# Patient Record
Sex: Female | Born: 1995 | Race: Black or African American | Hispanic: No | Marital: Single | Smoking: Never smoker
Health system: Southern US, Community
[De-identification: ages and names within clinical notes are randomized; demographics above are authoritative.]

## PROBLEM LIST (undated history)

## (undated) DIAGNOSIS — I456 Pre-excitation syndrome: Secondary | ICD-10-CM

---
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
## 2015-12-08 ENCOUNTER — Encounter (HOSPITAL_COMMUNITY): Payer: Self-pay | Admitting: Emergency Medicine

## 2015-12-08 ENCOUNTER — Emergency Department (HOSPITAL_COMMUNITY): Payer: No Typology Code available for payment source

## 2015-12-08 ENCOUNTER — Emergency Department (HOSPITAL_COMMUNITY)
Admission: EM | Admit: 2015-12-08 | Discharge: 2015-12-08 | Disposition: A | Discharge status: Home / Self Care | Payer: No Typology Code available for payment source | Attending: Emergency Medicine | Admitting: Emergency Medicine

## 2015-12-08 DIAGNOSIS — Z791 Long term (current) use of non-steroidal anti-inflammatories (NSAID): Secondary | ICD-10-CM | POA: Diagnosis not present

## 2015-12-08 DIAGNOSIS — Y92410 Unspecified street and highway as the place of occurrence of the external cause: Secondary | ICD-10-CM | POA: Diagnosis not present

## 2015-12-08 DIAGNOSIS — Y939 Activity, unspecified: Secondary | ICD-10-CM | POA: Diagnosis not present

## 2015-12-08 DIAGNOSIS — S199XXA Unspecified injury of neck, initial encounter: Secondary | ICD-10-CM | POA: Diagnosis present

## 2015-12-08 DIAGNOSIS — Z79899 Other long term (current) drug therapy: Secondary | ICD-10-CM | POA: Diagnosis not present

## 2015-12-08 DIAGNOSIS — S134XXA Sprain of ligaments of cervical spine, initial encounter: Secondary | ICD-10-CM | POA: Insufficient documentation

## 2015-12-08 DIAGNOSIS — Y999 Unspecified external cause status: Secondary | ICD-10-CM | POA: Diagnosis not present

## 2015-12-08 DIAGNOSIS — Z79891 Long term (current) use of opiate analgesic: Secondary | ICD-10-CM | POA: Insufficient documentation

## 2015-12-08 DIAGNOSIS — I456 Pre-excitation syndrome: Secondary | ICD-10-CM | POA: Insufficient documentation

## 2015-12-08 DIAGNOSIS — S139XXA Sprain of joints and ligaments of unspecified parts of neck, initial encounter: Secondary | ICD-10-CM

## 2015-12-08 MED ORDER — IBUPROFEN 200 MG PO TABS
600.0000 mg | ORAL_TABLET | Freq: Once | ORAL | Status: AC
  Administered 2015-12-08: 600 mg via ORAL
  Filled 2015-12-08: qty 3

## 2015-12-08 MED ORDER — IBUPROFEN 400 MG PO TABS: 400.0000 mg | ORAL_TABLET | Freq: Four times a day (QID) | ORAL | Status: DC | PRN
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
## 2016-08-23 DIAGNOSIS — Z862 Personal history of diseases of the blood and blood-forming organs and certain disorders involving the immune mechanism: Secondary | ICD-10-CM | POA: Insufficient documentation
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
## 2017-08-20 ENCOUNTER — Other Ambulatory Visit (HOSPITAL_COMMUNITY)
Admission: RE | Admit: 2017-08-20 | Discharge: 2017-08-20 | Disposition: A | Discharge status: Home / Self Care | Payer: Medicaid Other | Source: Ambulatory Visit | Attending: Obstetrics | Admitting: Obstetrics

## 2017-08-20 ENCOUNTER — Ambulatory Visit (INDEPENDENT_AMBULATORY_CARE_PROVIDER_SITE_OTHER): Payer: Medicaid Other | Admitting: Obstetrics

## 2017-08-20 ENCOUNTER — Encounter: Payer: Self-pay | Admitting: Obstetrics

## 2017-08-20 VITALS — BP 106/70 | HR 91 | Temp 99.4°F | Resp 18 | Wt 92.0 lb

## 2017-08-20 DIAGNOSIS — Z3009 Encounter for other general counseling and advice on contraception: Secondary | ICD-10-CM | POA: Diagnosis not present

## 2017-08-20 DIAGNOSIS — Z01419 Encounter for gynecological examination (general) (routine) without abnormal findings: Secondary | ICD-10-CM | POA: Insufficient documentation

## 2017-08-20 DIAGNOSIS — N926 Irregular menstruation, unspecified: Secondary | ICD-10-CM | POA: Insufficient documentation

## 2017-08-20 DIAGNOSIS — N946 Dysmenorrhea, unspecified: Secondary | ICD-10-CM | POA: Diagnosis not present

## 2017-08-20 DIAGNOSIS — Z30011 Encounter for initial prescription of contraceptive pills: Secondary | ICD-10-CM | POA: Diagnosis not present

## 2017-08-20 DIAGNOSIS — D508 Other iron deficiency anemias: Secondary | ICD-10-CM | POA: Diagnosis not present

## 2017-08-20 DIAGNOSIS — I456 Pre-excitation syndrome: Secondary | ICD-10-CM | POA: Insufficient documentation

## 2017-08-20 DIAGNOSIS — Z309 Encounter for contraceptive management, unspecified: Secondary | ICD-10-CM | POA: Diagnosis not present

## 2017-08-20 LAB — POCT URINE PREGNANCY: Preg Test, Ur: NEGATIVE

## 2017-08-20 MED ORDER — PRENATAL PLUS 27-1 MG PO TABS: 1.0000 | ORAL_TABLET | Freq: Every day | ORAL | 11 refills | Status: DC

## 2017-08-20 MED ORDER — IBUPROFEN 800 MG PO TABS: 800.0000 mg | ORAL_TABLET | Freq: Three times a day (TID) | ORAL | 5 refills | Status: DC | PRN

## 2017-08-20 MED ORDER — NORETHIN-ETH ESTRAD-FE BIPHAS 1 MG-10 MCG / 10 MCG PO TABS: 1.0000 | ORAL_TABLET | Freq: Every day | ORAL | 11 refills | Status: DC

## 2017-08-20 MED ORDER — CITRANATAL BLOOM DHA 90-1 & 300 MG PO MISC: 1.0000 | Freq: Every day | ORAL | 11 refills | Status: DC

## 2017-08-20 NOTE — Addendum Note (Signed)
Addended by: Natale MilchSTALLING, Shaindel Sweeten D on: 08/20/2017 03:09 PM   Modules accepted: Orders

## 2017-08-20 NOTE — Progress Notes (Signed)
Subjective:        Elizabeth Stanley is a 22 y.o. female here for a routine exam.  Current complaints: Irregular menstrual cycles..    Personal health questionnaire:  Is patient [name], have a family history of breast and/or ovarian cancer: no Is there a family history of uterine cancer diagnosed at age < 25, gastrointestinal cancer, urinary tract cancer, family member who is a Personnel officer syndrome-associated carrier: no Is the patient overweight and hypertensive, family history of diabetes, personal history of gestational diabetes, preeclampsia or PCOS: no Is patient over 49, have PCOS,  family history of premature CHD under age 76, diabetes, smoke, have hypertension or peripheral artery disease:  no At any time, has a partner hit, kicked or otherwise hurt or frightened you?: no Over the past 2 weeks, have you felt down, depressed or hopeless?: no Over the past 2 weeks, have you felt little interest or pleasure in doing things?:no   Gynecologic History Patient's last menstrual period was 06/17/2017 (approximate). Contraception: none Last Pap: none. Results were: n/a Last mammogram: n/a. Results were: n/a  Obstetric History OB History  No data available    Past Medical History:  Diagnosis Date  . Wolf-Parkinson-White syndrome     Past Surgical History:  Procedure Laterality Date  . ABLATION  2014     Current Outpatient Medications:  .  ibuprofen (ADVIL,MOTRIN) 400 MG tablet, Take 1 tablet (400 mg total) by mouth every 6 (six) hours as needed., Disp: 20 tablet, Rfl: 0 .  ibuprofen (ADVIL,MOTRIN) 800 MG tablet, Take 1 tablet (800 mg total) by mouth every 8 (eight) hours as needed for cramping., Disp: 30 tablet, Rfl: 5 .  Norethindrone-Ethinyl Estradiol-Fe Biphas (LO LOESTRIN FE) 1 MG-10 MCG / 10 MCG tablet, Take 1 tablet by mouth daily. Start taking pills on the first day of period., Disp: 1 Package, Rfl: 11 .  prenatal vitamin w/FE, FA (PRENATAL 1 + 1) 27-1 MG TABS  tablet, Take 1 tablet by mouth daily before breakfast., Disp: 30 each, Rfl: 11 Allergies  Allergen Reactions  . Penicillins Palpitations    Social History   Tobacco Use  . Smoking status: Never Smoker  . Smokeless tobacco: Never Used  Substance Use Topics  . Alcohol use: No    History reviewed. No pertinent family history.    Review of Systems  Constitutional: negative for fatigue and weight loss Respiratory: negative for cough and wheezing Cardiovascular: negative for chest pain, fatigue and palpitations Gastrointestinal: negative for abdominal pain and change in bowel habits Musculoskeletal:negative for myalgias Neurological: negative for gait problems and tremors Behavioral/Psych: negative for abusive relationship, depression Endocrine: negative for temperature intolerance    Genitourinary:negative for abnormal menstrual periods, genital lesions, hot flashes, sexual problems and vaginal discharge Integument/breast: negative for breast lump, breast tenderness, nipple discharge and skin lesion(s)    Objective:       BP 106/70 (BP Location: Right Arm, Patient Position: Sitting, Cuff Size: Small)   Pulse 91   Temp 99.4 F (37.4 C) (Oral)   Resp 18   Wt 92 lb (41.7 kg)   LMP 06/17/2017 (Approximate)   BMI 17.97 kg/m  General:   alert  Skin:   no rash or abnormalities  Lungs:   clear to auscultation bilaterally  Heart:   regular rate and rhythm, S1, S2 normal, no murmur, click, rub or gallop  Breasts:   normal without suspicious masses, skin or nipple changes or axillary nodes  Abdomen:  normal findings: no  organomegaly, soft, non-tender and no hernia  Pelvis:  External genitalia: normal general appearance Urinary system: urethral meatus normal and bladder without fullness, nontender Vaginal: normal without tenderness, induration or masses Cervix: normal appearance Adnexa: normal bimanual exam Uterus: anteverted and non-tender, normal size   Lab Review Urine  pregnancy test Labs reviewed yes Radiologic studies reviewed no  50% of 20 min visit spent on counseling and coordination of care.   Assessment:     1. Encounter for routine gynecological examination with Papanicolaou smear of cervix   2. Dysmenorrhea rX: - ibuprofen (ADVIL,MOTRIN) 800 MG tablet; Take 1 tablet (800 mg total) by mouth every 8 (eight) hours as needed for cramping.  Dispense: 30 tablet; Refill: 5  3. Abnormal menstrual cycle rX: - POCT urine pregnancy  4. Encounter for other general counseling and advice on contraception - WANTS ocp'S  5. Encounter for initial prescription of contraceptive pills rX: - Norethindrone-Ethinyl Estradiol-Fe Biphas (LO LOESTRIN FE) 1 MG-10 MCG / 10 MCG tablet; Take 1 tablet by mouth daily. Start taking pills on the first day of period.  Dispense: 1 Package; Refill: 11  6. Iron deficiency anemia secondary to inadequate dietary iron intake rX: - prenatal vitamin w/FE, FA (PRENATAL 1 + 1) 27-1 MG TABS tablet; Take 1 tablet by mouth daily before breakfast.  Dispense: 30 each; Refill: 11   Plan:    Education reviewed: calcium supplements, depression evaluation, low fat, low cholesterol diet, safe sex/STD prevention, self breast exams and weight bearing exercise. Contraception: OCP (estrogen/progesterone). Follow up in: 1 year.   Meds ordered this encounter  Medications  . Norethindrone-Ethinyl Estradiol-Fe Biphas (LO LOESTRIN FE) 1 MG-10 MCG / 10 MCG tablet    Sig: Take 1 tablet by mouth daily. Start taking pills on the first day of period.    Dispense:  1 Package    Refill:  11    Submit other coverage code 3  BIN:  [id number]  PCN:  [id number]   GRP:  [id number]   :  [id number]:  [id number]  . DISCONTD: Prenat w/o A-FeCbGl-DSS-FA-DHA (CITRANATAL BLOOM DHA) 90-1 & 300 MG MISC    Sig: Take 1 tablet by mouth daily before breakfast.    Dispense:  30 each    Refill:  11  . ibuprofen (ADVIL,MOTRIN) 800 MG tablet    Sig: Take 1 tablet (800 mg total) by mouth  every 8 (eight) hours as needed for cramping.    Dispense:  30 tablet    Refill:  5  . prenatal vitamin w/FE, FA (PRENATAL 1 + 1) 27-1 MG TABS tablet    Sig: Take 1 tablet by mouth daily before breakfast.    Dispense:  30 each    Refill:  11   Orders Placed This Encounter  Procedures  . POCT urine pregnancy    Brock BadHARLES A. HARPER MD

## 2017-08-21 ENCOUNTER — Other Ambulatory Visit: Payer: Self-pay | Admitting: Obstetrics

## 2017-08-21 DIAGNOSIS — B9689 Other specified bacterial agents as the cause of diseases classified elsewhere: Secondary | ICD-10-CM

## 2017-08-21 DIAGNOSIS — E569 Vitamin deficiency, unspecified: Secondary | ICD-10-CM

## 2017-08-21 DIAGNOSIS — N760 Acute vaginitis: Principal | ICD-10-CM

## 2017-08-21 LAB — CERVICOVAGINAL ANCILLARY ONLY
Bacterial vaginitis: POSITIVE — AB
CANDIDA VAGINITIS: NEGATIVE
CHLAMYDIA, DNA PROBE: NEGATIVE
NEISSERIA GONORRHEA: NEGATIVE
Trichomonas: NEGATIVE

## 2017-08-21 MED ORDER — SECNIDAZOLE 2 G PO PACK: 1.0000 | PACK | Freq: Once | ORAL | 2 refills | Status: AC

## 2017-08-21 MED ORDER — VITAFOL ULTRA 29-0.6-0.4-200 MG PO CAPS: 1.0000 | ORAL_CAPSULE | Freq: Every day | ORAL | 11 refills | Status: DC

## 2017-08-22 ENCOUNTER — Other Ambulatory Visit: Payer: Self-pay | Admitting: Obstetrics

## 2017-08-22 DIAGNOSIS — D508 Other iron deficiency anemias: Secondary | ICD-10-CM

## 2017-08-22 DIAGNOSIS — N946 Dysmenorrhea, unspecified: Secondary | ICD-10-CM

## 2017-08-22 MED ORDER — PRENATAL PLUS 27-1 MG PO TABS: 1.0000 | ORAL_TABLET | Freq: Every day | ORAL | 11 refills | Status: DC

## 2017-08-22 MED ORDER — IBUPROFEN 800 MG PO TABS: 800.0000 mg | ORAL_TABLET | Freq: Three times a day (TID) | ORAL | 5 refills | Status: DC | PRN

## 2017-08-23 LAB — CYTOLOGY - PAP
Diagnosis: NEGATIVE
HPV: NOT DETECTED

## 2018-01-29 ENCOUNTER — Other Ambulatory Visit: Payer: Self-pay

## 2018-01-29 ENCOUNTER — Encounter (HOSPITAL_COMMUNITY): Payer: Self-pay | Admitting: *Deleted

## 2018-01-29 ENCOUNTER — Emergency Department (HOSPITAL_COMMUNITY)
Admission: EM | Admit: 2018-01-29 | Discharge: 2018-01-29 | Disposition: A | Discharge status: Home / Self Care | Payer: Medicaid Other | Attending: Emergency Medicine | Admitting: Emergency Medicine

## 2018-01-29 DIAGNOSIS — R21 Rash and other nonspecific skin eruption: Secondary | ICD-10-CM | POA: Insufficient documentation

## 2018-01-29 DIAGNOSIS — R197 Diarrhea, unspecified: Secondary | ICD-10-CM | POA: Insufficient documentation

## 2018-01-29 DIAGNOSIS — Z79899 Other long term (current) drug therapy: Secondary | ICD-10-CM | POA: Insufficient documentation

## 2018-01-29 LAB — PREGNANCY, URINE: Preg Test, Ur: NEGATIVE

## 2018-01-29 MED ORDER — CETIRIZINE HCL 10 MG PO TABS: 10.0000 mg | ORAL_TABLET | Freq: Every day | ORAL | 0 refills | Status: DC

## 2018-01-29 MED ORDER — ACETAMINOPHEN 500 MG PO TABS: 1000.0000 mg | ORAL_TABLET | Freq: Once | ORAL | Status: DC

## 2018-01-29 NOTE — ED Triage Notes (Signed)
The pt is c/o a rash over her body intermittently one week  She is also c/o diarrhea and her grandmother reports that she has had palpitations intermittently  Hx wpw

## 2018-01-29 NOTE — ED Provider Notes (Signed)
[facility] EMERGENCY DEPARTMENT Provider Note   CSN: [id number] Arrival date & time: 01/29/18  1558     History   Chief Complaint Chief Complaint  Patient presents with  . Rash  . Diarrhea    HPI Elizabeth Stanley is a 22 y.o. female.  HPI  Patient is a 22 year old female with a history of Wolff-Parkinson-White syndrome (status post ablation), presenting for facial rash, diarrhea, and transient palpitations.  Patient reports that she developed the rash on her face within the last week.  No recent new soaps, lotions, detergents, medications, make-up.  Patient describes the rash is pruritic.  No erythema.  No intraoral lesions, or difficulty breathing or swallowing.  Patient denies any cough, wheezing, or shortness of breath.  Patient reports that she had one episode of soft stool yesterday, as well as one episode of watery stool earlier this morning.  Patient denies any in the last 12 hours.  Patient denies any abdominal pain, nausea or vomiting.  No fever or chills.  Patient reports that prior to the arrival to the emergency department, she experienced an episode of transient palpitations, which she reports she is experienced multiple times since her ablation for WPW, particularly when she "gets sick".  Patient denies any palpitations on arrival to the emergency department.  No remedies tried for rash or diarrhea.  Past Medical History:  Diagnosis Date  . Wolf-Parkinson-White syndrome     There are no active problems to display for this patient.   Past Surgical History:  Procedure Laterality Date  . ABLATION  2014     OB History   None      Home Medications    Prior to Admission medications   Medication Sig Start Date End Date Taking? Authorizing Provider  cetirizine (ZYRTEC) 10 MG tablet Take 1 tablet (10 mg total) by mouth daily for 14 days. 01/29/18 02/12/18  Aviva Kluver B, PA-C  ibuprofen (ADVIL,MOTRIN) 400 MG tablet Take 1 tablet (400 mg  total) by mouth every 6 (six) hours as needed. 12/08/15   Kirichenko, Tatyana, PA-C  ibuprofen (ADVIL,MOTRIN) 800 MG tablet Take 1 tablet (800 mg total) by mouth every 8 (eight) hours as needed for cramping. 08/22/17   Brock Bad, MD  Norethindrone-Ethinyl Estradiol-Fe Biphas (LO LOESTRIN FE) 1 MG-10 MCG / 10 MCG tablet Take 1 tablet by mouth daily. Start taking pills on the first day of period. 08/20/17   Brock Bad, MD  prenatal vitamin w/FE, FA (PRENATAL 1 + 1) 27-1 MG TABS tablet Take 1 tablet by mouth daily before breakfast. 08/22/17   Brock Bad, MD    Family History No family history on file.  Social History Social History   Tobacco Use  . Smoking status: Never Smoker  . Smokeless tobacco: Never Used  Substance Use Topics  . Alcohol use: No  . Drug use: No     Allergies   Penicillins   Review of Systems Review of Systems  Constitutional: Negative for chills and fever.  HENT: Negative for congestion and rhinorrhea.   Respiratory: Negative for cough and shortness of breath.   Cardiovascular: Positive for palpitations. Negative for chest pain.  Gastrointestinal: Positive for diarrhea. Negative for abdominal pain, nausea and vomiting.  Genitourinary: Negative for dysuria, frequency and urgency.  Musculoskeletal: Negative for myalgias.  Skin: Positive for rash.  Allergic/Immunologic: Negative for immunocompromised state.  Neurological: Negative for light-headedness.     Physical Exam Updated Vital Signs BP 103/74 (BP Location: Right  Arm)   Pulse 81   Temp 98 F (36.7 C) (Oral)   Resp 18   Ht 5\' 1"  (1.549 m)   Wt 54.4 kg (120 lb)   LMP 01/24/2018   SpO2 100%   BMI 22.67 kg/m   Physical Exam  Constitutional: She appears well-developed and well-nourished. No distress.  HENT:  Head: Normocephalic and atraumatic.  Mouth/Throat: Oropharynx is clear and moist.  Patient has multiple punctate, papular lesions scattered over the cheeks and a few on the  forehead.  No erythema.  No drainage or lesions.  No intraoral lesions.  Eyes: Pupils are equal, round, and reactive to light. Conjunctivae and EOM are normal.  Neck: Normal range of motion. Neck supple.  Cardiovascular: Normal rate, regular rhythm, S1 normal and S2 normal.  No murmur heard. Pulmonary/Chest: Effort normal and breath sounds normal. She has no wheezes. She has no rales.  Abdominal: Soft. She exhibits no distension. There is no tenderness. There is no guarding.  Musculoskeletal: Normal range of motion. She exhibits no edema or deformity.  Lymphadenopathy:    She has no cervical adenopathy.  Neurological: She is alert.  Cranial nerves grossly intact. Patient moves extremities symmetrically and with good coordination.  Skin: Skin is warm and dry. No rash noted. No erythema.  Psychiatric: She has a normal mood and affect. Her behavior is normal. Judgment and thought content normal.  Nursing note and vitals reviewed.    ED Treatments / Results  Labs (all labs ordered are listed, but only abnormal results are displayed) Labs Reviewed  PREGNANCY, URINE  POC URINE PREG, ED  POC URINE PREG, ED    EKG EKG Interpretation  Date/Time:  Wednesday January 29 2018 16:25:03 EDT Ventricular Rate:  81 PR Interval:  132 QRS Duration: 74 QT Interval:  358 QTC Calculation: 415 R Axis:   84 Text Interpretation:  Normal sinus rhythm Normal ECG Since last EKG, delta waves are no longer present Confirmed by Shaune Pollack [id number]) on 01/29/2018 7:47:54 PM   Radiology No results found.  Procedures Procedures (including critical care time)  Medications Ordered in ED Medications - No data to display   Initial Impression / Assessment and Plan / ED Course  I have reviewed the triage vital signs and the nursing notes.  Pertinent labs & imaging results that were available during my care of the patient were reviewed by me and considered in my medical decision making (see chart for  details).     Patient nontoxic-appearing, afebrile, and in no acute distress.  Rash is papular nature consistent with contact dermatitis versus folliculitis.  No evidence of cutaneous manifestations of systemic disease.  No evidence of tickborne illness, SJS/TEN.  Discussed with patient obtaining nonabrasive and mild facial cleanser, as well as taking Zyrtec and Benadryl for the pruritus.  Discussed that at this time, if not improving, she may need follow-up with primary care provider and/or dermatology.   Since diarrhea is resolving, feel that is most likely separate entity, and there is no evidence of dehydration today.  No abdominal pain associated with diarrhea.  We will treat supportively.  Patient was in normal sinus rhythm in the emergency department, EKG showed resolving changes that were previously seen with WPW.  I instructed patient to follow-up with primary care provider regarding whether she needs to be referred to adult cardiology, as patient was not sure if she has cardiology follow-up at this time.    Patient instructed return the emergency department for abdominal pain, intractable  nausea or vomiting, or fevers with abdominal pain, nausea vomiting and diarrhea.  Patient is in understanding and agrees with the plan of care.  Final Clinical Impressions(s) / ED Diagnoses   Final diagnoses:  Facial rash  Diarrhea, unspecified type    ED Discharge Orders        Ordered    cetirizine (ZYRTEC) 10 MG tablet  Daily     01/29/18 2008       Jenalyn Girdner B, PA-C 01/29/18 2354    Shaune PollackIsaacs, Cameron, MD 01/30/18 1026

## 2018-01-29 NOTE — Discharge Instructions (Signed)
Please see the information and instructions below regarding your visit.  Your diagnoses today include:  1. Facial rash   2. Diarrhea, unspecified type    Examination is very reassuring today.  Does not appear to be emergent cause of rash.  Most causes of diarrhea are benign, especially if they are self resolving, and not associated with fever, blood in the stool, or nausea or vomiting.  Tests performed today include: See side panel of your discharge paperwork for testing performed today. Vital signs are listed at the bottom of these instructions.   Your EKG is normal, and shows no evidence of the accessory pathway of WPW.  Medications prescribed:    Take any prescribed medications only as prescribed, and any over the counter medications only as directed on the packaging.  Please take Zyrtec, 10 mg daily for the itching.  You may also take Benadryl in between doses, 25 mg as long as you are not driving, drinking alcohol or operating machinery going class.  Please also initiate Cetaphil or generic version of such facial wash.  Home care instructions:  Please follow any educational materials contained in this packet.   Follow-up instructions: Please follow-up with your primary care provider in 1-2 weeks for further evaluation of your symptoms if they are not completely improved.    Return instructions:  Please return to the Emergency Department if you experience worsening symptoms.  Please return to the emergency department if you develop any worsening abdominal pain, nausea or vomiting that prevents you from keeping anything down, or new or worsening symptoms.  Please return if you have any other emergent concerns.  Additional Information:   Your vital signs today were: BP 103/74 (BP Location: Right Arm)    Pulse 81    Temp 98 F (36.7 C) (Oral)    Resp 18    Ht 5\' 1"  (1.549 m)    Wt 54.4 kg (120 lb)    LMP 01/24/2018    SpO2 100%    BMI 22.67 kg/m  If your blood pressure (BP)  was elevated on multiple readings during this visit above 130 for the top number or above 80 for the bottom number, please have this repeated by your primary care provider within one month. --------------  Thank you for allowing us to participate in your care today.

## 2018-01-30 LAB — POC URINE PREG, ED: Preg Test, Ur: NEGATIVE

## 2018-08-31 ENCOUNTER — Other Ambulatory Visit: Payer: Self-pay

## 2018-08-31 ENCOUNTER — Encounter (HOSPITAL_COMMUNITY): Payer: Self-pay | Admitting: Emergency Medicine

## 2018-08-31 ENCOUNTER — Emergency Department (HOSPITAL_COMMUNITY)
Admission: EM | Admit: 2018-08-31 | Discharge: 2018-08-31 | Disposition: A | Discharge status: Home / Self Care | Payer: Medicaid Other | Attending: Emergency Medicine | Admitting: Emergency Medicine

## 2018-08-31 DIAGNOSIS — K047 Periapical abscess without sinus: Secondary | ICD-10-CM

## 2018-08-31 MED ORDER — IBUPROFEN 600 MG PO TABS: 600.0000 mg | ORAL_TABLET | Freq: Four times a day (QID) | ORAL | 0 refills | Status: DC | PRN

## 2018-08-31 MED ORDER — CLINDAMYCIN HCL 150 MG PO CAPS: 150.0000 mg | ORAL_CAPSULE | Freq: Four times a day (QID) | ORAL | 0 refills | Status: AC

## 2018-08-31 NOTE — Discharge Instructions (Signed)
Return if any problems.

## 2018-08-31 NOTE — ED Triage Notes (Signed)
Pt reports pain left/ lower jaw. Pt is concerned that her wisdom teeth are infected. Swelling and tendereness noted below the jaw line.

## 2018-08-31 NOTE — ED Provider Notes (Signed)
[facility]-EMERGENCY DEPT Provider Note   CSN: [id number] Arrival date & time: 08/31/18  1343     History   Chief Complaint Chief Complaint  Patient presents with  . Dental Pain    HPI Elizabeth Stanley is a 23 y.o. female.  The history is provided by the patient. No language interpreter was used.  Dental Pain  Location:  Lower Lower teeth location:  17/LL 3rd molar Quality:  Aching Severity:  Moderate Onset quality:  Gradual Timing:  Constant Progression:  Worsening Chronicity:  New Relieved by:  Nothing Worsened by:  Nothing Ineffective treatments:  None tried Associated symptoms: facial pain   Associated symptoms: no drooling     Past Medical History:  Diagnosis Date  . Wolf-Parkinson-White syndrome     There are no active problems to display for this patient.   Past Surgical History:  Procedure Laterality Date  . ABLATION  2014     OB History   No obstetric history on file.      Home Medications    Prior to Admission medications   Medication Sig Start Date End Date Taking? Authorizing Provider  cetirizine (ZYRTEC) 10 MG tablet Take 1 tablet (10 mg total) by mouth daily for 14 days. 01/29/18 02/12/18  Aviva Kluver B, PA-C  clindamycin (CLEOCIN) 150 MG capsule Take 1 capsule (150 mg total) by mouth 4 (four) times daily for 10 days. 08/31/18 09/10/18  Elson Areas, PA-C  ibuprofen (ADVIL,MOTRIN) 600 MG tablet Take 1 tablet (600 mg total) by mouth every 6 (six) hours as needed. 08/31/18   Elson Areas, PA-C  Norethindrone-Ethinyl Estradiol-Fe Biphas (LO LOESTRIN FE) 1 MG-10 MCG / 10 MCG tablet Take 1 tablet by mouth daily. Start taking pills on the first day of period. 08/20/17   Brock Bad, MD  prenatal vitamin w/FE, FA (PRENATAL 1 + 1) 27-1 MG TABS tablet Take 1 tablet by mouth daily before breakfast. 08/22/17   Brock Bad, MD    Family History History reviewed. No pertinent family history.  Social  History Social History   Tobacco Use  . Smoking status: Never Smoker  . Smokeless tobacco: Never Used  Substance Use Topics  . Alcohol use: No  . Drug use: No     Allergies   Penicillins   Review of Systems Review of Systems  HENT: Positive for dental problem. Negative for drooling.   All other systems reviewed and are negative.    Physical Exam Updated Vital Signs BP 114/79   Pulse 84   Temp 98.6 F (37 C) (Oral)   Resp 18   Ht 5\' 1"  (1.549 m)   Wt 43.5 kg   LMP 08/11/2018 (Exact Date)   SpO2 99%   BMI 18.14 kg/m   Physical Exam Vitals signs reviewed. Exam conducted with a chaperone present.  HENT:     Head: Normocephalic.     Comments: Swelling left lower 3rd molar,     Nose: Nose normal.     Mouth/Throat:     Mouth: Mucous membranes are moist.  Eyes:     Pupils: Pupils are equal, round, and reactive to light.  Neck:     Musculoskeletal: Normal range of motion.  Cardiovascular:     Rate and Rhythm: Normal rate.  Pulmonary:     Effort: Pulmonary effort is normal.  Abdominal:     General: Abdomen is flat.  Skin:    General: Skin is warm.  Neurological:  General: No focal deficit present.     Mental Status: She is alert.  Psychiatric:        Mood and Affect: Mood normal.      ED Treatments / Results  Labs (all labs ordered are listed, but only abnormal results are displayed) Labs Reviewed - No data to display  EKG None  Radiology No results found.  Procedures Procedures (including critical care time)  Medications Ordered in ED Medications - No data to display   Initial Impression / Assessment and Plan / ED Course  I have reviewed the triage vital signs and the nursing notes.  Pertinent labs & imaging results that were available during my care of the patient were reviewed by me and considered in my medical decision making (see chart for details).       Final Clinical Impressions(s) / ED Diagnoses   Final diagnoses:   Dental infection    ED Discharge Orders         Ordered    clindamycin (CLEOCIN) 150 MG capsule  4 times daily     08/31/18 1456    ibuprofen (ADVIL,MOTRIN) 600 MG tablet  Every 6 hours PRN     08/31/18 1456        An After Visit Summary was printed and given to the patient.    Elson Areas, [place] 08/31/18 1843    Sabas Sous, MD 09/03/18 1726

## 2019-11-20 ENCOUNTER — Other Ambulatory Visit: Payer: Self-pay

## 2019-11-20 ENCOUNTER — Encounter (HOSPITAL_COMMUNITY): Payer: Self-pay | Admitting: Emergency Medicine

## 2019-11-20 DIAGNOSIS — Z5321 Procedure and treatment not carried out due to patient leaving prior to being seen by health care provider: Secondary | ICD-10-CM | POA: Insufficient documentation

## 2019-11-20 DIAGNOSIS — J029 Acute pharyngitis, unspecified: Secondary | ICD-10-CM | POA: Insufficient documentation

## 2019-11-20 NOTE — ED Triage Notes (Signed)
Patient states that last night she ate a popsicle and after the first bite, felt like something stuck her on the right side of her throat. Patient denies pain but states that it feels swollen and uncomfortable. Patient speaking in full sentences, able to eat and drink without problems.

## 2019-11-21 ENCOUNTER — Emergency Department (HOSPITAL_COMMUNITY)
Admission: EM | Admit: 2019-11-21 | Discharge: 2019-11-21 | Disposition: A | Discharge status: Home / Self Care | Payer: Medicaid Other | Attending: Emergency Medicine | Admitting: Emergency Medicine

## 2019-11-26 ENCOUNTER — Encounter (HOSPITAL_COMMUNITY): Payer: Self-pay

## 2019-11-26 ENCOUNTER — Other Ambulatory Visit: Payer: Self-pay

## 2019-11-26 ENCOUNTER — Ambulatory Visit (HOSPITAL_COMMUNITY)
Admission: EM | Admit: 2019-11-26 | Discharge: 2019-11-26 | Disposition: A | Discharge status: Home / Self Care | Payer: Self-pay | Attending: Physician Assistant | Admitting: Physician Assistant

## 2019-11-26 DIAGNOSIS — R0982 Postnasal drip: Secondary | ICD-10-CM

## 2019-11-26 DIAGNOSIS — R439 Unspecified disturbances of smell and taste: Secondary | ICD-10-CM

## 2019-11-26 DIAGNOSIS — J019 Acute sinusitis, unspecified: Secondary | ICD-10-CM

## 2019-11-26 MED ORDER — FAMOTIDINE 20 MG PO TABS: 20.0000 mg | ORAL_TABLET | Freq: Two times a day (BID) | ORAL | 0 refills | Status: DC

## 2019-11-26 MED ORDER — DOXYCYCLINE HYCLATE 100 MG PO CAPS: 100.0000 mg | ORAL_CAPSULE | Freq: Two times a day (BID) | ORAL | 0 refills | Status: AC

## 2019-11-26 MED ORDER — SALINE SPRAY 0.65 % NA SOLN: 1.0000 | NASAL | 0 refills | Status: DC | PRN

## 2019-11-26 NOTE — ED Provider Notes (Signed)
[facility]    CSN: [id number] Arrival date & time: 11/26/19  1546      History   Chief Complaint Chief Complaint  Patient presents with  . loss of smell    HPI Elizabeth Stanley is a 24 y.o. female.   Patient reports for evaluation of change in her smell.  She reports this has been ongoing for 3 to 4 weeks.  She reports she had a bad sinus infection in mid April and began to lose her sense of smell towards the end of this.  She reports all her sinus and cold-like symptoms resolved completely however she had continued loss of smell.  She reports over the last 2 weeks she has began to have a foul odor in her nose.  She denies any runny nose, congestion.  She does report occasional drainage in her throat.  Denies sore throat.  Denies facial pain or headache.  Denies fever or chills.  She denies anything being stuck in the nose.  She reports her nose piercings are several years old.  She reports she continues to not feel to smell things as they should smell.  She does believe that the foul odor has improved a little bit but is still lingering.  She reports is worse in the morning.  She believes she has bad breath but no one is told her this.  She denies any heartburn or reflux symptoms.     Past Medical History:  Diagnosis Date  . Wolf-Parkinson-White syndrome     There are no problems to display for this patient.   Past Surgical History:  Procedure Laterality Date  . ABLATION  2014    OB History   No obstetric history on file.      Home Medications    Prior to Admission medications   Medication Sig Start Date End Date Taking? Authorizing Provider  cetirizine (ZYRTEC) 10 MG tablet Take 1 tablet (10 mg total) by mouth daily for 14 days. 01/29/18 02/12/18  Aviva Kluver B, PA-C  doxycycline (VIBRAMYCIN) 100 MG capsule Take 1 capsule (100 mg total) by mouth 2 (two) times daily for 5 days. 11/26/19 12/01/19  Reyna Lorenzi, Veryl Speak, PA-C  famotidine (PEPCID) 20 MG tablet  Take 1 tablet (20 mg total) by mouth 2 (two) times daily. 11/26/19   Oriyah Lamphear, Veryl Speak, PA-C  ibuprofen (ADVIL,MOTRIN) 600 MG tablet Take 1 tablet (600 mg total) by mouth every 6 (six) hours as needed. 08/31/18   Elson Areas, PA-C  Norethindrone-Ethinyl Estradiol-Fe Biphas (LO LOESTRIN FE) 1 MG-10 MCG / 10 MCG tablet Take 1 tablet by mouth daily. Start taking pills on the first day of period. 08/20/17   Brock Bad, MD  prenatal vitamin w/FE, FA (PRENATAL 1 + 1) 27-1 MG TABS tablet Take 1 tablet by mouth daily before breakfast. 08/22/17   Brock Bad, MD  sodium chloride (OCEAN) 0.65 % SOLN nasal spray Place 1 spray into both nostrils as needed for congestion. 11/26/19   Taraji Mungo, Veryl Speak, PA-C    Family History History reviewed. No pertinent family history.  Social History Social History   Tobacco Use  . Smoking status: Never Smoker  . Smokeless tobacco: Never Used  Substance Use Topics  . Alcohol use: No  . Drug use: No     Allergies   Penicillins   Review of Systems Review of Systems   Physical Exam Triage Vital Signs ED Triage Vitals  Enc Vitals Group     BP  11/26/19 1607 113/72     Pulse Rate 11/26/19 1607 68     Resp 11/26/19 1607 18     Temp 11/26/19 1607 98.8 F (37.1 C)     Temp Source 11/26/19 1607 Oral     SpO2 11/26/19 1607 100 %     Weight 11/26/19 1608 89 lb 3.2 oz (40.5 kg)     Height --      Head Circumference --      Peak Flow --      Pain Score --      Pain Loc --      Pain Edu? --      Excl. in GC? --    No data found.  Updated Vital Signs BP 113/72 (BP Location: Right Arm)   Pulse 68   Temp 98.8 F (37.1 C) (Oral)   Resp 18   Wt 89 lb 3.2 oz (40.5 kg)   LMP 11/19/2019   SpO2 100%   BMI 16.85 kg/m   Visual Acuity Right Eye Distance:   Left Eye Distance:   Bilateral Distance:    Right Eye Near:   Left Eye Near:    Bilateral Near:     Physical Exam Vitals and nursing note reviewed.  Constitutional:      General: She is not  in acute distress.    Appearance: She is well-developed. She is not ill-appearing.  HENT:     Head: Normocephalic and atraumatic.     Right Ear: Tympanic membrane normal.     Left Ear: Tympanic membrane normal.     Nose:     Comments: Septal ring in place without sign of infection.  There are 2 stones in the right nostril without sign of infection  Turbinates are swollen bilaterally.  There is some nasal discharge on the right however not significant.  No maxillary, frontal or ethmoid sinus tenderness    Mouth/Throat:     Mouth: Mucous membranes are moist.     Comments: There is mild postnasal drip visible Eyes:     Extraocular Movements: Extraocular movements intact.     Conjunctiva/sclera: Conjunctivae normal.     Pupils: Pupils are equal, round, and reactive to light.  Cardiovascular:     Rate and Rhythm: Normal rate and regular rhythm.     Heart sounds: No murmur.  Pulmonary:     Effort: Pulmonary effort is normal. No respiratory distress.     Breath sounds: Normal breath sounds.  Musculoskeletal:     Cervical back: Neck supple.  Skin:    General: Skin is warm and dry.  Neurological:     General: No focal deficit present.     Mental Status: She is alert and oriented to person, place, and time.      UC Treatments / Results  Labs (all labs ordered are listed, but only abnormal results are displayed) Labs Reviewed - No data to display  EKG   Radiology No results found.  Procedures Procedures (including critical care time)  Medications Ordered in UC Medications - No data to display  Initial Impression / Assessment and Plan / UC Course  I have reviewed the triage vital signs and the nursing notes.  Pertinent labs & imaging results that were available during my care of the patient were reviewed by me and considered in my medical decision making (see chart for details).      #Sinusitis #Postnasal drip #Smell disturbance Patient is 24 year old with postnasal  drip and alteration in smell.  Per chart review patient has had 2 neg Covid test over the last month.  Was some consideration for possible false negative and illness reported in April.  Given preceding upper respiratory symptoms and continued postnasal drip with foul odor there is some suspicion for sinusitis.  We will try a 5-day course of doxycycline and encourage nasal saline use.  Also consideration for reflux given will trial famotidine.  Will have patient follow-up with primary care for reevaluation.  She verbalized understand this plan. Final Clinical Impressions(s) / UC Diagnoses   Final diagnoses:  Post-nasal drip  Smell disturbance  Acute sinusitis, recurrence not specified, unspecified location     Discharge Instructions     It is not completely clear why your symptoms are present  You may have a sinus infection that we can not see I would like to try a short course of antibiotics to see if this helps - doxycycline 2 times a day for 5 days - use nasal saline to aid in any further clearance  I would also like for you to day pepcid to see if this aids in the sense of bad breath and smell  Establish with a primary care for follow up       ED Prescriptions    Medication Sig Dispense Auth. Provider   doxycycline (VIBRAMYCIN) 100 MG capsule Take 1 capsule (100 mg total) by mouth 2 (two) times daily for 5 days. 10 capsule Gavino Fouch, Marguerita Beards, PA-C   sodium chloride (OCEAN) 0.65 % SOLN nasal spray Place 1 spray into both nostrils as needed for congestion. 44 mL Maikayla Beggs, Marguerita Beards, PA-C   famotidine (PEPCID) 20 MG tablet Take 1 tablet (20 mg total) by mouth 2 (two) times daily. 30 tablet Malkia Nippert, Marguerita Beards, PA-C     PDMP not reviewed this encounter.   Purnell Shoemaker, PA-C 11/26/19 2100

## 2019-11-26 NOTE — Discharge Instructions (Addendum)
It is not completely clear why your symptoms are present  You may have a sinus infection that we can not see I would like to try a short course of antibiotics to see if this helps - doxycycline 2 times a day for 5 days - use nasal saline to aid in any further clearance  I would also like for you to day pepcid to see if this aids in the sense of bad breath and smell  Establish with a primary care for follow up

## 2019-11-26 NOTE — ED Triage Notes (Signed)
Pt states she has loss her sense of smell. Pt states she has had a Covid test and it was negative a week ago. Pt states she smell something rotten inside of her nose all of the time now.

## 2020-02-09 ENCOUNTER — Encounter: Payer: Self-pay | Admitting: Obstetrics

## 2020-02-09 ENCOUNTER — Ambulatory Visit (INDEPENDENT_AMBULATORY_CARE_PROVIDER_SITE_OTHER): Payer: Self-pay | Admitting: Obstetrics

## 2020-02-09 ENCOUNTER — Other Ambulatory Visit (HOSPITAL_COMMUNITY)
Admission: RE | Admit: 2020-02-09 | Discharge: 2020-02-09 | Disposition: A | Discharge status: Home / Self Care | Payer: Medicaid Other | Source: Ambulatory Visit | Attending: Obstetrics | Admitting: Obstetrics

## 2020-02-09 VITALS — BP 105/64 | HR 84 | Wt 88.8 lb

## 2020-02-09 DIAGNOSIS — Z3009 Encounter for other general counseling and advice on contraception: Secondary | ICD-10-CM

## 2020-02-09 DIAGNOSIS — N898 Other specified noninflammatory disorders of vagina: Secondary | ICD-10-CM | POA: Diagnosis present

## 2020-02-09 DIAGNOSIS — R636 Underweight: Secondary | ICD-10-CM

## 2020-02-09 DIAGNOSIS — Z01419 Encounter for gynecological examination (general) (routine) without abnormal findings: Secondary | ICD-10-CM | POA: Diagnosis present

## 2020-02-09 DIAGNOSIS — Z681 Body mass index (BMI) 19 or less, adult: Secondary | ICD-10-CM

## 2020-02-09 DIAGNOSIS — Z3202 Encounter for pregnancy test, result negative: Secondary | ICD-10-CM

## 2020-02-09 LAB — POCT URINE PREGNANCY: Preg Test, Ur: NEGATIVE

## 2020-02-09 NOTE — Progress Notes (Signed)
Subjective:        Elizabeth Stanley is a 24 y.o. female here for a routine exam.  Current complaints: none.    Personal health questionnaire:  Is patient Elizabeth Stanley, have a family history of breast and/or ovarian cancer: no Is there a family history of uterine cancer diagnosed at age < 37, gastrointestinal cancer, urinary tract cancer, family member who is a Personnel officer syndrome-associated carrier: no Is the patient overweight and hypertensive, family history of diabetes, personal history of gestational diabetes, preeclampsia or PCOS: no Is patient over 63, have PCOS,  family history of premature CHD under age 49, diabetes, smoke, have hypertension or peripheral artery disease:  no At any time, has a partner hit, kicked or otherwise hurt or frightened you?: no Over the past 2 weeks, have you felt down, depressed or hopeless?: no Over the past 2 weeks, have you felt little interest or pleasure in doing things?:no   Gynecologic History Patient's last menstrual period was 12/30/2019 (exact date). Contraception: none Last Pap: 2019. Results were: normal Last mammogram: n/a. Results were: n/a  Obstetric History OB History  Gravida Para Term Preterm AB Living  0 0 0 0 0 0  SAB TAB Ectopic Multiple Live Births  0 0 0 0 0    Past Medical History:  Diagnosis Date  . Wolf-Parkinson-White syndrome     Past Surgical History:  Procedure Laterality Date  . ABLATION  2014     Current Outpatient Medications:  .  cetirizine (ZYRTEC) 10 MG tablet, Take 1 tablet (10 mg total) by mouth daily for 14 days., Disp: 14 tablet, Rfl: 0 .  famotidine (PEPCID) 20 MG tablet, Take 1 tablet (20 mg total) by mouth 2 (two) times daily. (Patient not taking: Reported on 02/09/2020), Disp: 30 tablet, Rfl: 0 .  ibuprofen (ADVIL,MOTRIN) 600 MG tablet, Take 1 tablet (600 mg total) by mouth every 6 (six) hours as needed. (Patient not taking: Reported on 02/09/2020), Disp: 30 tablet, Rfl: 0 .   Norethindrone-Ethinyl Estradiol-Fe Biphas (LO LOESTRIN FE) 1 MG-10 MCG / 10 MCG tablet, Take 1 tablet by mouth daily. Start taking pills on the first day of period. (Patient not taking: Reported on 02/09/2020), Disp: 1 Package, Rfl: 11 .  prenatal vitamin w/FE, FA (PRENATAL 1 + 1) 27-1 MG TABS tablet, Take 1 tablet by mouth daily before breakfast. (Patient not taking: Reported on 02/09/2020), Disp: 30 each, Rfl: 11 .  sodium chloride (OCEAN) 0.65 % SOLN nasal spray, Place 1 spray into both nostrils as needed for congestion. (Patient not taking: Reported on 02/09/2020), Disp: 44 mL, Rfl: 0 Allergies  Allergen Reactions  . Penicillins Palpitations    Social History   Tobacco Use  . Smoking status: Never Smoker  . Smokeless tobacco: Never Used  Substance Use Topics  . Alcohol use: No    History reviewed. No pertinent family history.    Review of Systems  Constitutional: negative for fatigue and weight loss Respiratory: negative for cough and wheezing Cardiovascular: negative for chest pain, fatigue and palpitations Gastrointestinal: negative for abdominal pain and change in bowel habits Musculoskeletal:negative for myalgias Neurological: negative for gait problems and tremors Behavioral/Psych: negative for abusive relationship, depression Endocrine: negative for temperature intolerance    Genitourinary:negative for abnormal menstrual periods, genital lesions, hot flashes, sexual problems and vaginal discharge Integument/breast: negative for breast lump, breast tenderness, nipple discharge and skin lesion(s)    Objective:       BP (!) 105/64   Pulse 84  Wt (!) 88 lb 12.8 oz (40.3 kg)   LMP 12/30/2019 (Exact Date)   BMI 16.78 kg/m  General:   alert and no distress  Skin:   no rash or abnormalities  Lungs:   clear to auscultation bilaterally  Heart:   regular rate and rhythm, S1, S2 normal, no murmur, click, rub or gallop  Breasts:   normal without suspicious masses, skin or  nipple changes or axillary nodes  Abdomen:  normal findings: no organomegaly, soft, non-tender and no hernia  Pelvis:  External genitalia: normal general appearance Urinary system: urethral meatus normal and bladder without fullness, nontender Vaginal: normal without tenderness, induration or masses Cervix: normal appearance Adnexa: normal bimanual exam Uterus: anteverted and non-tender, normal size   Lab Review Urine pregnancy test Labs reviewed yes Radiologic studies reviewed no  50% of 20 min visit spent on counseling and coordination of care.   Assessment:     1. Encounter for routine gynecological examination with Papanicolaou smear of cervix Rx: - Cytology - PAP( [facility]) - POCT urine pregnancy  2. Vaginal discharge Rx: - Cervicovaginal ancillary only  3. Encounter for other general counseling and advice on contraception - declines contraception  4. Mildly underweight adult.  Probably hereditary. - will follow clinically    Plan:    Education reviewed: calcium supplements, depression evaluation, low fat, low cholesterol diet, safe sex/STD prevention, self breast exams and weight bearing exercise. Contraception: none. Follow up in: 1 year.    Orders Placed This Encounter  Procedures  . POCT urine pregnancy    Brock Bad, MD 02/09/2020 3:26 PM

## 2020-02-09 NOTE — Progress Notes (Signed)
Pt is here for annual gyn exam. Last pap 08/20/2017, normal. Pt is currently SA, she declines any contraception at this time.

## 2020-02-10 LAB — CERVICOVAGINAL ANCILLARY ONLY
Bacterial Vaginitis (gardnerella): NEGATIVE
Candida Glabrata: NEGATIVE
Candida Vaginitis: NEGATIVE
Chlamydia: NEGATIVE
Comment: NEGATIVE
Comment: NEGATIVE
Comment: NEGATIVE
Comment: NEGATIVE
Comment: NEGATIVE
Comment: NORMAL
Neisseria Gonorrhea: NEGATIVE
Trichomonas: NEGATIVE

## 2020-02-11 LAB — CYTOLOGY - PAP: Diagnosis: NEGATIVE

## 2020-03-13 ENCOUNTER — Other Ambulatory Visit: Payer: Self-pay

## 2020-03-13 ENCOUNTER — Emergency Department (HOSPITAL_COMMUNITY)
Admission: EM | Admit: 2020-03-13 | Discharge: 2020-03-13 | Disposition: A | Discharge status: Home / Self Care | Payer: Medicaid Other | Attending: Emergency Medicine | Admitting: Emergency Medicine

## 2020-03-13 ENCOUNTER — Encounter (HOSPITAL_COMMUNITY): Payer: Self-pay

## 2020-03-13 DIAGNOSIS — Z5321 Procedure and treatment not carried out due to patient leaving prior to being seen by health care provider: Secondary | ICD-10-CM | POA: Insufficient documentation

## 2020-03-13 DIAGNOSIS — R110 Nausea: Secondary | ICD-10-CM | POA: Insufficient documentation

## 2020-03-13 DIAGNOSIS — R109 Unspecified abdominal pain: Secondary | ICD-10-CM | POA: Insufficient documentation

## 2020-03-13 LAB — BASIC METABOLIC PANEL
Anion gap: 6 (ref 5–15)
BUN: 21 mg/dL — ABNORMAL HIGH (ref 6–20)
CO2: 22 mmol/L (ref 22–32)
Calcium: 9.1 mg/dL (ref 8.9–10.3)
Chloride: 108 mmol/L (ref 98–111)
Creatinine, Ser: 0.77 mg/dL (ref 0.44–1.00)
GFR calc Af Amer: >60 mL/min (ref >60)
GFR calc non Af Amer: >60 mL/min (ref >60)
Glucose, Bld: 106 mg/dL — ABNORMAL HIGH (ref 70–99)
Potassium: 3.6 mmol/L (ref 3.5–5.1)
Sodium: 136 mmol/L (ref 135–145)

## 2020-03-13 LAB — URINALYSIS, ROUTINE W REFLEX MICROSCOPIC
Bilirubin Urine: NEGATIVE
Glucose, UA: NEGATIVE mg/dL
Hgb urine dipstick: NEGATIVE
Ketones, ur: NEGATIVE mg/dL
Leukocytes,Ua: NEGATIVE
Nitrite: NEGATIVE
Protein, ur: NEGATIVE mg/dL
Specific Gravity, Urine: 1.024 (ref 1.005–1.030)
pH: 6 (ref 5.0–8.0)

## 2020-03-13 LAB — CBC
HCT: 33.4 % — ABNORMAL LOW (ref 36.0–46.0)
Hemoglobin: 11.2 g/dL — ABNORMAL LOW (ref 12.0–15.0)
MCH: 29.7 pg (ref 26.0–34.0)
MCHC: 33.5 g/dL (ref 30.0–36.0)
MCV: 88.6 fL (ref 80.0–100.0)
Platelets: 194 10*3/uL (ref 150–400)
RBC: 3.77 MIL/uL — ABNORMAL LOW (ref 3.87–5.11)
RDW: 13.3 % (ref 11.5–15.5)
WBC: 5.8 10*3/uL (ref 4.0–10.5)
nRBC: 0 % (ref 0.0–0.2)

## 2020-03-13 LAB — PREGNANCY, URINE: Preg Test, Ur: NEGATIVE

## 2020-03-13 NOTE — ED Triage Notes (Signed)
Arrived POV from home. Patient reports left flank pain that woke her from her sleep this morning. Patient also reports nausea without vomiting

## 2020-03-14 LAB — URINE CULTURE: Culture: NO GROWTH

## 2020-05-18 ENCOUNTER — Other Ambulatory Visit: Payer: Self-pay

## 2020-05-18 ENCOUNTER — Encounter: Payer: Self-pay | Admitting: Family Medicine

## 2020-05-18 ENCOUNTER — Ambulatory Visit (INDEPENDENT_AMBULATORY_CARE_PROVIDER_SITE_OTHER): Payer: Self-pay | Admitting: Family Medicine

## 2020-05-18 VITALS — BP 100/63 | HR 85 | Temp 98.3°F | Ht 61.0 in | Wt 91.0 lb

## 2020-05-18 DIAGNOSIS — R7309 Other abnormal glucose: Secondary | ICD-10-CM

## 2020-05-18 DIAGNOSIS — Z13220 Encounter for screening for lipoid disorders: Secondary | ICD-10-CM

## 2020-05-18 DIAGNOSIS — D649 Anemia, unspecified: Secondary | ICD-10-CM

## 2020-05-18 DIAGNOSIS — F411 Generalized anxiety disorder: Secondary | ICD-10-CM

## 2020-05-18 DIAGNOSIS — R109 Unspecified abdominal pain: Secondary | ICD-10-CM

## 2020-05-18 DIAGNOSIS — Z1321 Encounter for screening for nutritional disorder: Secondary | ICD-10-CM

## 2020-05-18 NOTE — Patient Instructions (Signed)
Health Maintenance, Female Adopting a healthy lifestyle and getting preventive care are important in promoting health and wellness. Ask your health care provider about:  The right schedule for you to have regular tests and exams.  Things you can do on your own to prevent diseases and keep yourself healthy. What should I know about diet, weight, and exercise? Eat a healthy diet   Eat a diet that includes plenty of vegetables, fruits, low-fat dairy products, and lean protein.  Do not eat a lot of foods that are high in solid fats, added sugars, or sodium. Maintain a healthy weight Body mass index (BMI) is used to identify weight problems. It estimates body fat based on height and weight. Your health care provider can help determine your BMI and help you achieve or maintain a healthy weight. Get regular exercise Get regular exercise. This is one of the most important things you can do for your health. Most adults should:  Exercise for at least 150 minutes each week. The exercise should increase your heart rate and make you sweat (moderate-intensity exercise).  Do strengthening exercises at least twice a week. This is in addition to the moderate-intensity exercise.  Spend less time sitting. Even light physical activity can be beneficial. Watch cholesterol and blood lipids Have your blood tested for lipids and cholesterol at 24 years of age, then have this test every 5 years. Have your cholesterol levels checked more often if:  Your lipid or cholesterol levels are high.  You are older than 24 years of age.  You are at high risk for heart disease. What should I know about cancer screening? Depending on your health history and family history, you may need to have cancer screening at various ages. This may include screening for:  Breast cancer.  Cervical cancer.  Colorectal cancer.  Skin cancer.  Lung cancer. What should I know about heart disease, diabetes, and high blood  pressure? Blood pressure and heart disease  High blood pressure causes heart disease and increases the risk of stroke. This is more likely to develop in people who have high blood pressure readings, are of African descent, or are overweight.  Have your blood pressure checked: ? Every 3-5 years if you are 18-39 years of age. ? Every year if you are 40 years old or older. Diabetes Have regular diabetes screenings. This checks your fasting blood sugar level. Have the screening done:  Once every three years after age 40 if you are at a normal weight and have a low risk for diabetes.  More often and at a younger age if you are overweight or have a high risk for diabetes. What should I know about preventing infection? Hepatitis B If you have a higher risk for hepatitis B, you should be screened for this virus. Talk with your health care provider to find out if you are at risk for hepatitis B infection. Hepatitis C Testing is recommended for:  Everyone born from 1945 through 1965.  Anyone with known risk factors for hepatitis C. Sexually transmitted infections (STIs)  Get screened for STIs, including gonorrhea and chlamydia, if: ? You are sexually active and are younger than 24 years of age. ? You are older than 24 years of age and your health care provider tells you that you are at risk for this type of infection. ? Your sexual activity has changed since you were last screened, and you are at increased risk for chlamydia or gonorrhea. Ask your health care provider if   you are at risk.  Ask your health care provider about whether you are at high risk for HIV. Your health care provider may recommend a prescription medicine to help prevent HIV infection. If you choose to take medicine to prevent HIV, you should first get tested for HIV. You should then be tested every 3 months for as long as you are taking the medicine. Pregnancy  If you are about to stop having your period (premenopausal) and  you may become pregnant, seek counseling before you get pregnant.  Take 400 to 800 micrograms (mcg) of folic acid every day if you become pregnant.  Ask for birth control (contraception) if you want to prevent pregnancy. Osteoporosis and menopause Osteoporosis is a disease in which the bones lose minerals and strength with aging. This can result in bone fractures. If you are 65 years old or older, or if you are at risk for osteoporosis and fractures, ask your health care provider if you should:  Be screened for bone loss.  Take a calcium or vitamin D supplement to lower your risk of fractures.  Be given hormone replacement therapy (HRT) to treat symptoms of menopause. Follow these instructions at home: Lifestyle  Do not use any products that contain nicotine or tobacco, such as cigarettes, e-cigarettes, and chewing tobacco. If you need help quitting, ask your health care provider.  Do not use street drugs.  Do not share needles.  Ask your health care provider for help if you need support or information about quitting drugs. Alcohol use  Do not drink alcohol if: ? Your health care provider tells you not to drink. ? You are pregnant, may be pregnant, or are planning to become pregnant.  If you drink alcohol: ? Limit how much you use to 0-1 drink a day. ? Limit intake if you are breastfeeding.  Be aware of how much alcohol is in your drink. In the [place] one drink equals one 12 oz bottle of beer (355 mL), one 5 oz glass of wine (148 mL), or one 1 oz glass of hard liquor (44 mL). General instructions  Schedule regular health, dental, and eye exams.  Stay current with your vaccines.  Tell your health care provider if: ? You often feel depressed. ? You have ever been abused or do not feel safe at home. Summary  Adopting a healthy lifestyle and getting preventive care are important in promoting health and wellness.  Follow your health care provider's instructions about healthy  diet, exercising, and getting tested or screened for diseases.  Follow your health care provider's instructions on monitoring your cholesterol and blood pressure. This information is not intended to replace advice given to you by your health care provider. Make sure you discuss any questions you have with your health care provider. Document Revised: 06/25/2018 Document Reviewed: 06/25/2018 Elsevier Patient Education  2020 [employer]vier Inc[employer]  

## 2020-05-18 NOTE — Progress Notes (Signed)
11/3/20212:39 PM  Elizabeth Stanley [birth year], 25 y.o., female [id number]  Chief Complaint  Patient presents with  . New Patient (Initial Visit)  . Gastroesophageal Reflux    x 1 to 2 months while laying down   . lab results from 8/29 ER visit    states did not receive results     HPI:   Patient is a 23 y.o. female with no past medical history who presents today for annual physical   Screening Flu: Declines Covid: Declines Hepatitis C screen: declines HIV screen: declines  Gyn: Dr. 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   Allergies  Allergen Reactions  . Penicillins Palpitations    Prior to Admission medications   Medication Sig Start Date End Date Taking? Authorizing Provider  cetirizine (ZYRTEC) 10 MG tablet Take 1 tablet (10 mg total) by mouth daily for 14 days. 01/29/18 02/12/18  Aviva Kluver B, PA-C  famotidine (PEPCID) 20 MG tablet Take 1 tablet (20 mg total) by mouth 2 (two) times daily. Patient not taking: Reported on 02/09/2020 11/26/19   Darr, Gerilyn Pilgrim, PA-C  ibuprofen (ADVIL,MOTRIN) 600 MG tablet Take 1 tablet (600 mg total) by mouth every 6 (six) hours as needed. Patient not taking: Reported on 02/09/2020 08/31/18   Elson Areas, PA-C  Norethindrone-Ethinyl Estradiol-Fe Biphas (LO LOESTRIN FE) 1 MG-10 MCG / 10 MCG tablet Take 1 tablet by mouth daily. Start taking pills on the first day of period. Patient not taking: Reported on 02/09/2020 08/20/17   Brock Bad, MD  prenatal vitamin w/FE, FA (PRENATAL 1 + 1) 27-1 MG TABS tablet Take 1 tablet by mouth daily before breakfast. Patient not taking:  Reported on 02/09/2020 08/22/17   Brock Bad, MD  sodium chloride (OCEAN) 0.65 % SOLN nasal spray Place 1 spray into both nostrils as needed for congestion. Patient not taking: Reported on 02/09/2020 11/26/19   Darr, Celine Mans    Past Medical History:  Diagnosis Date  . Wolf-Parkinson-White syndrome     Past Surgical History:  Procedure Laterality Date  . ABLATION  2014    Social History   Tobacco Use  . Smoking status: Never Smoker  . Smokeless tobacco: Never Used  Substance Use Topics  . Alcohol use: No    No family history on file.  Review of Systems  Constitutional: Negative for chills, fever and malaise/fatigue.  HENT: Negative for hearing loss and sinus pain.   Eyes: Negative for blurred vision and double vision.  Respiratory: Negative for cough, shortness of breath and wheezing.   Cardiovascular: Negative for chest pain, palpitations and leg swelling.  Gastrointestinal: Negative for abdominal pain, blood in stool, constipation, diarrhea, heartburn, nausea and vomiting.  Genitourinary: Positive for flank pain (Left side 3 weeks ago, none currently). Negative for dysuria, frequency and hematuria.  Musculoskeletal: Negative for back pain and joint pain.  Skin: Negative for rash.  Neurological: Negative for dizziness, weakness and headaches.  Psychiatric/Behavioral: Negative for depression. The patient is nervous/anxious.      OBJECTIVE:  Today's Vitals   05/18/20 1400  BP: 100/63  Pulse: 85  Temp: 98.3 F (36.8 C)  SpO2: 98%  Weight: 91 lb (41.3 kg)  Height: 5\' 1"  (1.549 m)   Body mass index is 17.19 kg/m.   Physical Exam Vitals reviewed.  Constitutional:      Appearance: Normal appearance.  HENT:     Right Ear: Tympanic membrane and ear canal normal.     Left Ear: Tympanic membrane and ear canal normal.     Nose: Nose normal.  Eyes:     Extraocular Movements: Extraocular movements intact.     Pupils: Pupils are equal, round, and reactive to  light.  Cardiovascular:     Rate and Rhythm: Normal rate and regular rhythm.     Pulses: Normal pulses.     Heart sounds: Normal heart sounds. No murmur heard.  No friction rub. No gallop.   Pulmonary:     Effort: Pulmonary effort is normal. No respiratory distress.     Breath sounds: Normal breath sounds. No wheezing, rhonchi or rales.  Abdominal:     General: Abdomen is flat. Bowel sounds are normal. There is no distension.     Palpations: Abdomen is soft.     Tenderness: There is no abdominal tenderness. There is no right CVA tenderness or left CVA tenderness.  Musculoskeletal:        General: Normal range of motion.     Cervical back: Normal range of motion.  Skin:    General: Skin is warm and dry.     Capillary Refill: Capillary refill takes less than 2 seconds.  Neurological:     General: No focal deficit present.     Mental Status: She is alert and oriented to person, place, and time.  Psychiatric:        Mood and Affect: Mood normal.        Behavior: Behavior normal.     No results found for this or any previous visit (from the past 24 hour(s)).  No results found.   ASSESSMENT and PLAN  Problem List Items Addressed This Visit    None    Visit Diagnoses    Anemia, unspecified type    -  Primary   Relevant Orders   CBC   Reticulocytes   Iron, TIBC and Ferritin Panel   Screening for lipid disorders       Relevant Orders   Lipid Panel   Encounter for vitamin deficiency screening       Relevant Orders   Vitamin D, 25-hydroxy   Elevated random blood glucose level       Relevant Orders   Hemoglobin A1c   Abdominal pain, unspecified abdominal location       Relevant Orders   Comprehensive metabolic panel   Generalized anxiety disorder        Will follow up with lab results Discussed health maintenance, educated on vaccines and birth control.   Return in about 1 year (around 05/18/2021).    13/09/2020 Ardella Chhim, FNP-BC Primary Care at [facility] [address] [place], [place] Ph.  [phone] Fax [phone]

## 2020-05-19 ENCOUNTER — Encounter: Payer: Self-pay | Admitting: Family Medicine

## 2020-05-19 LAB — IRON,TIBC AND FERRITIN PANEL
Ferritin: 16 ng/mL (ref 15–150)
Iron Saturation: 14 % — ABNORMAL LOW (ref 15–55)
Iron: 46 ug/dL (ref 27–159)
Total Iron Binding Capacity: 318 ug/dL (ref 250–450)
UIBC: 272 ug/dL (ref 131–425)

## 2020-05-19 LAB — LIPID PANEL
Chol/HDL Ratio: 2.7 ratio (ref 0.0–4.4)
Cholesterol, Total: 162 mg/dL (ref 100–199)
HDL: 59 mg/dL (ref >39)
LDL Chol Calc (NIH): 82 mg/dL (ref 0–99)
Triglycerides: 116 mg/dL (ref 0–149)
VLDL Cholesterol Cal: 21 mg/dL (ref 5–40)

## 2020-05-19 LAB — COMPREHENSIVE METABOLIC PANEL
ALT: 16 IU/L (ref 0–32)
AST: 23 IU/L (ref 0–40)
Albumin/Globulin Ratio: 1.7 (ref 1.2–2.2)
Albumin: 4.4 g/dL (ref 3.9–5.0)
Alkaline Phosphatase: 80 IU/L (ref 44–121)
BUN/Creatinine Ratio: 14 (ref 9–23)
BUN: 11 mg/dL (ref 6–20)
Bilirubin Total: 0.3 mg/dL (ref 0.0–1.2)
CO2: 25 mmol/L (ref 20–29)
Calcium: 9.4 mg/dL (ref 8.7–10.2)
Chloride: 106 mmol/L (ref 96–106)
Creatinine, Ser: 0.78 mg/dL (ref 0.57–1.00)
GFR calc Af Amer: 123 mL/min/{1.73_m2} (ref >59)
GFR calc non Af Amer: 107 mL/min/{1.73_m2} (ref >59)
Globulin, Total: 2.6 g/dL (ref 1.5–4.5)
Glucose: 82 mg/dL (ref 65–99)
Potassium: 3.9 mmol/L (ref 3.5–5.2)
Sodium: 142 mmol/L (ref 134–144)
Total Protein: 7 g/dL (ref 6.0–8.5)

## 2020-05-19 LAB — CBC
Hematocrit: 38.9 % (ref 34.0–46.6)
Hemoglobin: 12.6 g/dL (ref 11.1–15.9)
MCH: 28.9 pg (ref 26.6–33.0)
MCHC: 32.4 g/dL (ref 31.5–35.7)
MCV: 89 fL (ref 79–97)
Platelets: 228 10*3/uL (ref 150–450)
RBC: 4.36 x10E6/uL (ref 3.77–5.28)
RDW: 13.2 % (ref 11.7–15.4)
WBC: 4.1 10*3/uL (ref 3.4–10.8)

## 2020-05-19 LAB — HEMOGLOBIN A1C
Est. average glucose Bld gHb Est-mCnc: 108 mg/dL
Hgb A1c MFr Bld: 5.4 % (ref 4.8–5.6)

## 2020-05-19 LAB — RETICULOCYTES: Retic Ct Pct: 1.2 % (ref 0.6–2.6)

## 2020-05-19 LAB — VITAMIN D 25 HYDROXY (VIT D DEFICIENCY, FRACTURES): Vit D, 25-Hydroxy: 7.5 ng/mL — ABNORMAL LOW (ref 30.0–100.0)

## 2020-07-13 ENCOUNTER — Telehealth: Payer: Self-pay

## 2020-07-13 ENCOUNTER — Encounter: Payer: Self-pay | Admitting: Family Medicine

## 2020-07-13 NOTE — Telephone Encounter (Signed)
Called pt regarding her MyChart message about having a Question about her A1C. Informed the pt her A1C was 5.4 and that she was within normal range. I explained to the pt the prediabetes was 5.6-6.4. pt seemed to under stand this information at the time of the call.

## 2020-07-26 ENCOUNTER — Encounter: Payer: Self-pay | Admitting: Family Medicine

## 2020-07-26 ENCOUNTER — Ambulatory Visit (INDEPENDENT_AMBULATORY_CARE_PROVIDER_SITE_OTHER): Payer: Self-pay | Admitting: Family Medicine

## 2020-07-26 ENCOUNTER — Other Ambulatory Visit: Payer: Self-pay

## 2020-07-26 VITALS — BP 103/57 | HR 100 | Temp 98.2°F | Ht 61.0 in | Wt 87.0 lb

## 2020-07-26 DIAGNOSIS — G43009 Migraine without aura, not intractable, without status migrainosus: Secondary | ICD-10-CM

## 2020-07-26 DIAGNOSIS — E559 Vitamin D deficiency, unspecified: Secondary | ICD-10-CM | POA: Insufficient documentation

## 2020-07-26 MED ORDER — RIZATRIPTAN BENZOATE 5 MG PO TABS: 5.0000 mg | ORAL_TABLET | ORAL | 1 refills | Status: AC | PRN

## 2020-07-26 NOTE — Patient Instructions (Addendum)
Migraine Headache A migraine headache is an intense, throbbing pain on one side or both sides of the head. Migraine headaches may also cause other symptoms, such as nausea, vomiting, and sensitivity to light and noise. A migraine headache can last from 4 hours to 3 days. Talk with your doctor about what things may bring on (trigger) your migraine headaches. What are the causes? The exact cause of this condition is not known. However, a migraine may be caused when nerves in the brain become irritated and release chemicals that cause inflammation of blood vessels. This inflammation causes pain. This condition may be triggered or caused by:  Drinking alcohol.  Smoking.  Taking medicines, such as: ? Medicine used to treat chest pain (nitroglycerin). ? Birth control pills. ? Estrogen. ? Certain blood pressure medicines.  Eating or drinking products that contain nitrates, glutamate, aspartame, or tyramine. Aged cheeses, chocolate, or caffeine may also be triggers.  Doing physical activity. Other things that may trigger a migraine headache include:  Menstruation.  Pregnancy.  Hunger.  Stress.  Lack of sleep or too much sleep.  Weather changes.  Fatigue. What increases the risk? The following factors may make you more likely to experience migraine headaches:  Being a certain age. This condition is more common in people who are 35-1 years old.  Being female.  Having a family history of migraine headaches.  Being Caucasian.  Having a mental health condition, such as depression or anxiety.  Being obese. What are the signs or symptoms? The main symptom of this condition is pulsating or throbbing pain. This pain may:  Happen in any area of the head, such as on one side or both sides.  Interfere with daily activities.  Get worse with physical activity.  Get worse with exposure to bright lights or loud noises. Other symptoms may  include:  Nausea.  Vomiting.  Dizziness.  General sensitivity to bright lights, loud noises, or smells. Before you get a migraine headache, you may get warning signs (an aura). An aura may include:  Seeing flashing lights or having blind spots.  Seeing bright spots, halos, or zigzag lines.  Having tunnel vision or blurred vision.  Having numbness or a tingling feeling.  Having trouble talking.  Having muscle weakness. Some people have symptoms after a migraine headache (postdromal phase), such as:  Feeling tired.  Difficulty concentrating. How is this diagnosed? A migraine headache can be diagnosed based on:  Your symptoms.  A physical exam.  Tests, such as: ? CT scan or an MRI of the head. These imaging tests can help rule out other causes of headaches. ? Taking fluid from the spine (lumbar puncture) and analyzing it (cerebrospinal fluid analysis, or CSF analysis). How is this treated? This condition may be treated with medicines that:  Relieve pain.  Relieve nausea.  Prevent migraine headaches. Treatment for this condition may also include:  Acupuncture.  Lifestyle changes like avoiding foods that trigger migraine headaches.  Biofeedback.  Cognitive behavioral therapy. Follow these instructions at home: Medicines  Take over-the-counter and prescription medicines only as told by your health care provider.  Ask your health care provider if the medicine prescribed to you: ? Requires you to avoid driving or using heavy machinery. ? Can cause constipation. You may need to take these actions to prevent or treat constipation:  Drink enough fluid to keep your urine pale yellow.  Take over-the-counter or prescription medicines.  Eat foods that are high in fiber, such as beans, whole grains,  and fresh fruits and vegetables.  Limit foods that are high in fat and processed sugars, such as fried or sweet foods. Lifestyle  Do not drink alcohol.  Do not  use any products that contain nicotine or tobacco, such as cigarettes, e-cigarettes, and chewing tobacco. If you need help quitting, ask your health care provider.  Get at least 8 hours of sleep every night.  Find ways to manage stress, such as meditation, deep breathing, or yoga. General instructions  Keep a journal to find out what may trigger your migraine headaches. For example, write down: ? What you eat and drink. ? How much sleep you get. ? Any change to your diet or medicines.  If you have a migraine headache: ? Avoid things that make your symptoms worse, such as bright lights. ? It may help to lie down in a dark, quiet room. ? Do not drive or use heavy machinery. ? Ask your health care provider what activities are safe for you while you are experiencing symptoms.  Keep all follow-up visits as told by your health care provider. This is important.      Contact a health care provider if:  You develop symptoms that are different or more severe than your usual migraine headache symptoms.  You have more than 15 headache days in one month. Get help right away if:  Your migraine headache becomes severe.  Your migraine headache lasts longer than 72 hours.  You have a fever.  You have a stiff neck.  You have vision loss.  Your muscles feel weak or like you cannot control them.  You start to lose your balance often.  You have trouble walking.  You faint.  You have a seizure. Summary  A migraine headache is an intense, throbbing pain on one side or both sides of the head. Migraines may also cause other symptoms, such as nausea, vomiting, and sensitivity to light and noise.  This condition may be treated with medicines and lifestyle changes. You may also need to avoid certain things that trigger a migraine headache.  Keep a journal to find out what may trigger your migraine headaches.  Contact your health care provider if you have more than 15 headache days in a  month or you develop symptoms that are different or more severe than your usual migraine headache symptoms. This information is not intended to replace advice given to you by your health care provider. Make sure you discuss any questions you have with your health care provider. Document Revised: 10/24/2018 Document Reviewed: 08/14/2018 Elsevier Patient Education  2021 [employer].   If you have lab work done today you will be contacted with your lab results within the next 2 weeks.  If you have not heard from [place] then please contact us. The fastest way to get your results is to register for My Chart.   IF you received an x-ray today, you will receive an invoice from [facility]. Please contact [facility] at [phone] with questions or concerns regarding your invoice.   IF you received labwork today, you will receive an invoice from [facility]. Please contact [facility] at [phone] with questions or concerns regarding your invoice.   Our billing staff will not be able to assist you with questions regarding bills from these companies.  You will be contacted with the lab results as soon as they are available. The fastest way to get your results is to activate your My Chart account. Instructions are located on the last  page of this paperwork. If you have not heard from [place] regarding the results in 2 weeks, please contact this office.

## 2020-07-26 NOTE — Progress Notes (Signed)
1/11/202210:17 AM  Elizabeth Stanley February 09, 1996, 25 y.o., female [id number]  Chief Complaint  Patient presents with  . Headache    Hit R side head on car door a few months ago and has had sharp pains intermitently since    HPI:   Patient is a 25 y.o. female with past medical history significant for WPW with ablation and anemia who presents today for headaches.  Sharp pain on back of head every now and then Bumped head on car door 2 months ago Notices this pain once or twice a week Lasts off and on the whole day Later turns into a headache Denies neck and shoulder pain Denies nausea and photophobia Denies headaches in the past   Last vitamin D Declined prescription vitamin D weekly, will continue OTC supplement Lab Results  Component Value Date   VD25OH 7.5 (L) 05/18/2020   Lab Results  Component Value Date   HGBA1C 5.4 05/18/2020     Depression screen PHQ 2/9 07/26/2020 08/20/2017  Decreased Interest 0 0  Down, Depressed, Hopeless 0 -  PHQ - 2 Score 0 0    Fall Risk  07/26/2020  Falls in the past year? 0  Number falls in past yr: 0  Injury with Fall? 0  Follow up Falls evaluation completed     Allergies  Allergen Reactions  . Penicillins Palpitations    Prior to Admission medications   Medication Sig Start Date End Date Taking? Authorizing Provider  Ergocalciferol (VITAMIN D2) 50 MCG (2000 UT) TABS Take by mouth.   Yes [provider]    Past Medical History:  Diagnosis Date  . Wolf-Parkinson-White syndrome     Past Surgical History:  Procedure Laterality Date  . ABLATION  2014    Social History   Tobacco Use  . Smoking status: Never Smoker  . Smokeless tobacco: Never Used  Substance Use Topics  . Alcohol use: No    History reviewed. No pertinent family history.  Review of Systems  Constitutional: Negative for chills, fever and malaise/fatigue.  HENT: Negative for hearing loss and tinnitus.   Eyes: Negative for blurred  vision, double vision and photophobia.  Respiratory: Negative for cough, shortness of breath and wheezing.   Cardiovascular: Negative for chest pain and palpitations.  Gastrointestinal: Negative for abdominal pain, blood in stool, constipation, diarrhea, heartburn, nausea and vomiting.  Genitourinary: Negative for dysuria, frequency and hematuria.  Musculoskeletal: Negative for back pain, falls, joint pain, myalgias and neck pain.  Skin: Negative for rash.  Neurological: Positive for headaches. Negative for dizziness, tingling, tremors, sensory change and weakness.     OBJECTIVE:  Today's Vitals   07/26/20 0955  BP: (!) 103/57  Pulse: 100  Temp: 98.2 F (36.8 C)  SpO2: 97%  Weight: 87 lb (39.5 kg)  Height: 5\' 1"  (1.549 m)   Body mass index is 16.44 kg/m.   Physical Exam Constitutional:      General: She is not in acute distress.    Appearance: Normal appearance. She is not ill-appearing.  HENT:     Head: Normocephalic.  Eyes:     Extraocular Movements: Extraocular movements intact.     Pupils: Pupils are equal, round, and reactive to light.  Cardiovascular:     Rate and Rhythm: Normal rate and regular rhythm.     Pulses: Normal pulses.     Heart sounds: Normal heart sounds. No murmur heard. No friction rub. No gallop.   Pulmonary:     Effort: Pulmonary effort  is normal. No respiratory distress.     Breath sounds: Normal breath sounds. No stridor. No wheezing, rhonchi or rales.  Abdominal:     General: Bowel sounds are normal.     Palpations: Abdomen is soft.     Tenderness: There is no abdominal tenderness.  Musculoskeletal:     Cervical back: Full passive range of motion without pain, normal range of motion and neck supple. No rigidity. No muscular tenderness.     Right lower leg: No edema.     Left lower leg: No edema.  Skin:    General: Skin is warm and dry.  Neurological:     General: No focal deficit present.     Mental Status: She is alert and oriented to  person, place, and time. Mental status is at baseline.     Sensory: No sensory deficit.     Motor: No weakness.     Coordination: Coordination normal.  Psychiatric:        Mood and Affect: Mood normal.        Behavior: Behavior normal.     No results found for this or any previous visit (from the past 24 hour(s)).  No results found.   ASSESSMENT and PLAN  Problem List Items Addressed This Visit      Other   Vitamin D deficiency    Other Visit Diagnoses    Migraine without aura and without status migrainosus, not intractable    -  Primary   Relevant Medications   rizatriptan (MAXALT) 5 MG tablet    Plan:  Encouraged to keep a headache diary Will attempt maxalt, discussed r/se/b of treatment Discussed potential scans and follow up plan RTC/ ED precautions provided Discussed conservative and OTC treatments. Continue Vitamin D replacement OTC   Return in about 3 months (around 10/24/2020).    Macario Carls Porfirio Bollier, FNP-BC Primary Care at [facility] [address] [place], [place] [address] Ph.  [phone] Fax [phone]

## 2020-10-24 ENCOUNTER — Ambulatory Visit: Payer: Medicaid Other | Admitting: Family Medicine

## 2020-10-31 ENCOUNTER — Ambulatory Visit: Payer: Self-pay | Admitting: Family Medicine

## 2021-04-12 ENCOUNTER — Encounter (HOSPITAL_COMMUNITY): Payer: Self-pay | Admitting: Emergency Medicine

## 2021-04-12 ENCOUNTER — Other Ambulatory Visit: Payer: Self-pay

## 2021-04-12 ENCOUNTER — Emergency Department (HOSPITAL_COMMUNITY): Payer: Self-pay

## 2021-04-12 ENCOUNTER — Emergency Department (HOSPITAL_COMMUNITY)
Admission: EM | Admit: 2021-04-12 | Discharge: 2021-04-12 | Disposition: A | Discharge status: Home / Self Care | Payer: Self-pay | Attending: Emergency Medicine | Admitting: Emergency Medicine

## 2021-04-12 DIAGNOSIS — R079 Chest pain, unspecified: Secondary | ICD-10-CM | POA: Insufficient documentation

## 2021-04-12 DIAGNOSIS — R002 Palpitations: Secondary | ICD-10-CM | POA: Insufficient documentation

## 2021-04-12 LAB — CBC
HCT: 35.6 % — ABNORMAL LOW (ref 36.0–46.0)
Hemoglobin: 12.1 g/dL (ref 12.0–15.0)
MCH: 29.4 pg (ref 26.0–34.0)
MCHC: 34 g/dL (ref 30.0–36.0)
MCV: 86.6 fL (ref 80.0–100.0)
Platelets: 191 10*3/uL (ref 150–400)
RBC: 4.11 MIL/uL (ref 3.87–5.11)
RDW: 13.5 % (ref 11.5–15.5)
WBC: 6 10*3/uL (ref 4.0–10.5)
nRBC: 0 % (ref 0.0–0.2)

## 2021-04-12 LAB — BASIC METABOLIC PANEL
Anion gap: 5 (ref 5–15)
BUN: 15 mg/dL (ref 6–20)
CO2: 25 mmol/L (ref 22–32)
Calcium: 9.6 mg/dL (ref 8.9–10.3)
Chloride: 106 mmol/L (ref 98–111)
Creatinine, Ser: 0.7 mg/dL (ref 0.44–1.00)
GFR, Estimated: >60 mL/min (ref >60)
Glucose, Bld: 122 mg/dL — ABNORMAL HIGH (ref 70–99)
Potassium: 3.3 mmol/L — ABNORMAL LOW (ref 3.5–5.1)
Sodium: 136 mmol/L (ref 135–145)

## 2021-04-12 LAB — HCG, QUANTITATIVE, PREGNANCY: hCG, Beta Chain, Quant, S: <1 m[IU]/mL (ref <5)

## 2021-04-12 LAB — TROPONIN I (HIGH SENSITIVITY): Troponin I (High Sensitivity): <2 ng/L (ref <18)

## 2021-04-12 MED ORDER — POTASSIUM CHLORIDE CRYS ER 20 MEQ PO TBCR
40.0000 meq | EXTENDED_RELEASE_TABLET | Freq: Once | ORAL | Status: AC
  Administered 2021-04-12: 40 meq via ORAL
  Filled 2021-04-12: qty 2

## 2021-04-12 NOTE — ED Triage Notes (Signed)
Pt reports sudden onset of chest tightness and pain that began yesterday. Also endorses tingling in the left arm and pain in left jaw. Hx of WPW and cardiac ablation procedure. Pt states she has these sx occasionally, but the sx have been occurring more frequently, recently.

## 2021-04-12 NOTE — Discharge Instructions (Addendum)
Follow up with your Cardiologist  Return for new or worsening symptoms

## 2021-04-12 NOTE — ED Notes (Signed)
I-stat beta changed to hCG quantitative. Sent to main lab. [employer]

## 2021-04-12 NOTE — ED Provider Notes (Signed)
[facility]-EMERGENCY DEPT Provider Note   CSN: [id number] Arrival date & time: 04/12/21  0051    History Chief Complaint  Patient presents with   Chest Pain    Elizabeth Stanley is a 25 y.o. female with past medical history significant for WPW, S/p ablation in 2014 presents for chest pain, palpitations.  Intermittent.  Began yesterday.  Nonexertional, nonpleuritic in nature.  States will also intermittently get tingling all over when this occurs.  No back pain.  She has been given these episodes more frequently as of lately.  She is not currently followed by cardiology or EP.  No headache, neck pain, unilateral weakness, facial droop, back pain, abdominal pain, lateral leg swelling, redness or warmth.  No prior history of PE or DVT.  No recent surgery, immobilization or malignancy.  Rates her current pain a 0/10.  Currently asymptomatic.  History obtained from patient, friend in room and past medical records.  No interpreter used.  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  Social History   Tobacco Use   Smoking status: Never   Smokeless tobacco: Never  Vaping Use   Vaping Use: Never used  Substance Use Topics   Alcohol use: No   Drug use: No    Home Medications Prior to Admission medications   Medication Sig Start Date End Date Taking? Authorizing Provider  Ergocalciferol (VITAMIN D2) 50 MCG (2000 UT) TABS Take  by mouth.    [provider]  rizatriptan (MAXALT) 5 MG tablet Take 1 tablet (5 mg total) by mouth as needed for migraine. May repeat in 2 hours if needed 07/26/20   Just, Azalee Course, FNP    Allergies    Penicillins  Review of Systems   Review of Systems  Constitutional: Negative.   HENT: Negative.    Respiratory: Negative.    Cardiovascular:  Positive for chest pain and palpitations. Negative for leg swelling.  Gastrointestinal: Negative.   Genitourinary: Negative.   Musculoskeletal: Negative.   Skin: Negative.   Neurological:  Negative for dizziness, tremors, seizures, syncope, facial asymmetry, speech difficulty, weakness, light-headedness and headaches.       Tingling all over  All other systems reviewed and are negative.  Physical Exam Updated Vital Signs BP 109/61   Pulse 98   Temp 98.2 F (36.8 C) (Oral)   Resp 18   Ht 5\' 1"  (1.549 m)   Wt 40.4 kg   LMP 03/03/2021   SpO2 100%   BMI 16.82 kg/m   Physical Exam Vitals and nursing note reviewed.  Constitutional:      General: She is not in acute distress.  Appearance: She is well-developed. She is not ill-appearing, toxic-appearing or diaphoretic.  HENT:     Head: Normocephalic and atraumatic.  Eyes:     Pupils: Pupils are equal, round, and reactive to light.  Cardiovascular:     Rate and Rhythm: Normal rate.     Pulses:          Radial pulses are 2+ on the right side and 2+ on the left side.       Dorsalis pedis pulses are 2+ on the right side and 2+ on the left side.     Heart sounds: Normal heart sounds.  Pulmonary:     Effort: Pulmonary effort is normal. No respiratory distress.     Breath sounds: Normal breath sounds.     Comments: Clear Bl, speaks in full sentences without difficulty Chest:     Comments: Equal rise and fall to chest wall, nontender Abdominal:     General: Bowel sounds are normal. There is no distension.     Palpations: Abdomen is soft. There is no mass.     Tenderness: There  is no abdominal tenderness. There is no guarding or rebound.     Comments: Soft, nontender  Musculoskeletal:        General: Normal range of motion.     Cervical back: Normal range of motion.     Right lower leg: No tenderness. No edema.     Left lower leg: No tenderness. No edema.     Comments: No bony tenderness.  Moves all 4 extremities without difficulty.  Compartment soft.  Skin:    General: Skin is warm and dry.     Capillary Refill: Capillary refill takes less than 2 seconds.  Neurological:     General: No focal deficit present.     Mental Status: She is alert.     Comments: Mental Status:  Alert, oriented, thought content appropriate. Speech fluent without evidence of aphasia. Able to follow 2 step commands without difficulty.  Cranial Nerves:  II:  Peripheral visual fields grossly normal, pupils equal, round, reactive to light III,IV, VI: ptosis not present, extra-ocular motions intact bilaterally  V,VII: smile symmetric, facial light touch sensation equal VIII: hearing grossly normal bilaterally  IX,X: midline uvula rise  XI: bilateral shoulder shrug equal and strong XII: midline tongue extension  Motor:  5/5 in upper and lower extremities bilaterally including strong and equal grip strength and dorsiflexion/plantar flexion Sensory: Touch normal in all extremities.  Cerebellar: normal finger-to-nose with bilateral upper extremities Gait: normal gait and balance CV: distal pulses palpable throughout    Psychiatric:        Mood and Affect: Mood normal.    ED Results / Procedures / Treatments   Labs (all labs ordered are listed, but only abnormal results are displayed) Labs Reviewed  BASIC METABOLIC PANEL - Abnormal; Notable for the following components:      Result Value   Potassium 3.3 (*)    Glucose, Bld 122 (*)    All other components within normal limits  CBC - Abnormal; Notable for the following components:   HCT 35.6 (*)    All other components within normal  limits  HCG, QUANTITATIVE, PREGNANCY  I-STAT BETA HCG BLOOD, ED ([facility], [facility], [facility] ONLY)  TROPONIN I (HIGH SENSITIVITY)    EKG None  Radiology DG Chest 2 View  Result Date: 04/12/2021 CLINICAL DATA:  Chest pain EXAM: CHEST - 2 VIEW COMPARISON:  03/29/2011 FINDINGS: Lungs are clear.  No pleural effusion or  pneumothorax. The heart is normal in size. Visualized osseous structures are within normal limits. IMPRESSION: Normal chest radiographs. Electronically Signed   By: Charline Bills M.D.   On: 04/12/2021 01:38    Procedures Procedures   Medications Ordered in ED Medications  potassium chloride SA (KLOR-CON) CR tablet 40 mEq (40 mEq Oral Given 04/12/21 0414)   ED Course  I have reviewed the triage vital signs and the nursing notes.  Pertinent labs & imaging results that were available during my care of the patient were reviewed by me and considered in my medical decision making (see chart for details).  15 -year-old here for evaluation of chest pain and palpitations.  Nonexertional nonpleuritic in nature.  She is afebrile, nonseptic, non-ill-appearing. Sx began yesterday. Palpitations chest tightness and al over tingling sensation. She is PERC negative, Wells criteria low risk.  Symptoms not seem consistent with ACS, dissection.  She has no clinical evidence of VTE on exam.  She has a nonfocal neuro exam without deficits.   Work up started from triage which I personally reviewed and interpreted:  Trop <2 Preg neg CBC without leukocytosis BMP mild hypokalemia, given supplementation DG chest without acute abnormality EKG without ischemic changes  Unclear etiology of patient's symptoms.  No arrhythmia here in the emergency department.  Work-up reassuring.  We will have her follow-up with her prior cardiologist.  She will return for new worsening symptoms.  The patient has been appropriately medically screened and/or stabilized in the ED. I have low suspicion for any other emergent  medical condition which would require further screening, evaluation or treatment in the ED or require inpatient management.  Patient is hemodynamically stable and in no acute distress.  Patient able to ambulate in department prior to ED.  Evaluation does not show acute pathology that would require ongoing or additional emergent interventions while in the emergency department or further inpatient treatment.  I have discussed the diagnosis with the patient and answered all questions.  Pain is been managed while in the emergency department and patient has no further complaints prior to discharge.  Patient is comfortable with plan discussed in room and is stable for discharge at this time.  I have discussed strict return precautions for returning to the emergency department.  Patient was encouraged to follow-up with PCP/specialist refer to at discharge.      MDM Rules/Calculators/A&P                            Final Clinical Impression(s) / ED Diagnoses Final diagnoses:  Nonspecific chest pain  Palpitations    Rx / DC Orders ED Discharge Orders     None        Klair Leising A, PA-C 04/12/21 0415    Molpus, Jonny Ruiz, MD 04/12/21 [phone]

## 2021-05-25 ENCOUNTER — Encounter: Payer: Medicaid Other | Admitting: Family Medicine

## 2021-07-20 DIAGNOSIS — I456 Pre-excitation syndrome: Secondary | ICD-10-CM | POA: Diagnosis not present

## 2021-07-20 DIAGNOSIS — K111 Hypertrophy of salivary gland: Secondary | ICD-10-CM | POA: Diagnosis not present

## 2021-07-20 DIAGNOSIS — Z807 Family history of other malignant neoplasms of lymphoid, hematopoietic and related tissues: Secondary | ICD-10-CM | POA: Diagnosis not present

## 2021-07-20 DIAGNOSIS — Z0000 Encounter for general adult medical examination without abnormal findings: Secondary | ICD-10-CM | POA: Diagnosis not present

## 2021-08-08 DIAGNOSIS — N3000 Acute cystitis without hematuria: Secondary | ICD-10-CM | POA: Diagnosis not present

## 2021-09-14 ENCOUNTER — Ambulatory Visit: Payer: Medicaid Other | Admitting: Obstetrics

## 2022-09-18 ENCOUNTER — Encounter: Payer: Self-pay | Admitting: Obstetrics

## 2022-09-18 ENCOUNTER — Ambulatory Visit (INDEPENDENT_AMBULATORY_CARE_PROVIDER_SITE_OTHER): Payer: Medicaid Other | Admitting: Obstetrics

## 2022-09-18 ENCOUNTER — Other Ambulatory Visit (HOSPITAL_COMMUNITY)
Admission: RE | Admit: 2022-09-18 | Discharge: 2022-09-18 | Disposition: A | Discharge status: Home / Self Care | Payer: Medicaid Other | Source: Ambulatory Visit | Attending: Obstetrics | Admitting: Obstetrics

## 2022-09-18 VITALS — BP 117/69 | HR 86 | Ht 61.0 in | Wt 89.0 lb

## 2022-09-18 DIAGNOSIS — N946 Dysmenorrhea, unspecified: Secondary | ICD-10-CM | POA: Diagnosis not present

## 2022-09-18 DIAGNOSIS — Z30016 Encounter for initial prescription of transdermal patch hormonal contraceptive device: Secondary | ICD-10-CM

## 2022-09-18 DIAGNOSIS — Z113 Encounter for screening for infections with a predominantly sexual mode of transmission: Secondary | ICD-10-CM

## 2022-09-18 DIAGNOSIS — Z01419 Encounter for gynecological examination (general) (routine) without abnormal findings: Secondary | ICD-10-CM

## 2022-09-18 DIAGNOSIS — N898 Other specified noninflammatory disorders of vagina: Secondary | ICD-10-CM | POA: Insufficient documentation

## 2022-09-18 DIAGNOSIS — D508 Other iron deficiency anemias: Secondary | ICD-10-CM

## 2022-09-18 DIAGNOSIS — Z3009 Encounter for other general counseling and advice on contraception: Secondary | ICD-10-CM

## 2022-09-18 MED ORDER — IBUPROFEN 800 MG PO TABS: 800.0000 mg | ORAL_TABLET | Freq: Three times a day (TID) | ORAL | 5 refills | Status: AC | PRN

## 2022-09-18 MED ORDER — XULANE 150-35 MCG/24HR TD PTWK: 1.0000 | MEDICATED_PATCH | TRANSDERMAL | 12 refills | Status: DC

## 2022-09-18 NOTE — Progress Notes (Addendum)
27 y.o GYN presents for AEX/PAP/STD screening.  Pt wants the Patch for BC.    C/o heavy periods, cramps 9/10.  UPT today is Negative.

## 2022-09-18 NOTE — Progress Notes (Unsigned)
Subjective:        Elizabeth Stanley is a 27 y.o. female here for a routine exam.  Current complaints: Heavy, painful periods..    Personal health questionnaire:  Is patient [name], have a family history of breast and/or ovarian cancer: no Is there a family history of uterine cancer diagnosed at age < 32, gastrointestinal cancer, urinary tract cancer, family member who is a Field seismologist syndrome-associated carrier: no Is the patient overweight and hypertensive, family history of diabetes, personal history of gestational diabetes, preeclampsia or PCOS: no Is patient over 61, have PCOS,  family history of premature CHD under age 3, diabetes, smoke, have hypertension or peripheral artery disease:  no At any time, has a partner hit, kicked or otherwise hurt or frightened you?: no Over the past 2 weeks, have you felt down, depressed or hopeless?: no Over the past 2 weeks, have you felt little interest or pleasure in doing things?:no   Gynecologic History Patient's last menstrual period was 09/14/2022 (exact date). Contraception: none Last Pap: 2021. Results were: normal Last mammogram: n/a. Results were: n/a  Obstetric History OB History  Gravida Para Term Preterm AB Living  0 0 0 0 0 0  SAB IAB Ectopic Multiple Live Births  0 0 0 0 0    Past Medical History:  Diagnosis Date   Wolf-Parkinson-White syndrome     Past Surgical History:  Procedure Laterality Date   ABLATION  2014     Current Outpatient Medications:    ibuprofen (ADVIL) 800 MG tablet, Take 1 tablet (800 mg total) by mouth every 8 (eight) hours as needed. Start taking monthly from the 1st day of period until the end of period., Disp: 30 tablet, Rfl: 5   XULANE 150-35 MCG/24HR transdermal patch, Place 1 patch onto the skin once a week., Disp: 3 patch, Rfl: 12   Ergocalciferol (VITAMIN D2) 50 MCG (2000 UT) TABS, Take by mouth., Disp: , Rfl:    rizatriptan (MAXALT) 5 MG tablet, Take 1 tablet (5 mg total) by  mouth as needed for migraine. May repeat in 2 hours if needed, Disp: 15 tablet, Rfl: 1 Allergies  Allergen Reactions   Penicillins Palpitations    Social History   Tobacco Use   Smoking status: Never   Smokeless tobacco: Never  Substance Use Topics   Alcohol use: Yes    History reviewed. No pertinent family history.    Review of Systems  Constitutional: negative for fatigue and weight loss Respiratory: negative for cough and wheezing Cardiovascular: negative for chest pain, fatigue and palpitations Gastrointestinal: negative for abdominal pain and change in bowel habits Musculoskeletal:negative for myalgias Neurological: negative for gait problems and tremors Behavioral/Psych: negative for abusive relationship, depression Endocrine: negative for temperature intolerance    Genitourinary: positive for vaginal discharge and heavy, painful periods.  negative for genital lesions, hot flashes, sexual problems  Integument/breast: negative for breast lump, breast tenderness, nipple discharge and skin lesion(s)    Objective:       BP 117/69   Pulse 86   Ht '5\' 1"'$  (1.549 m)   Wt 89 lb (40.4 kg)   LMP 09/14/2022 (Exact Date)   BMI 16.82 kg/m  General:   Alert and no distress  Skin:   no rash or abnormalities  Lungs:   clear to auscultation bilaterally  Heart:   regular rate and rhythm, S1, S2 normal, no murmur, click, rub or gallop  Breasts:   normal without suspicious masses, skin or nipple changes  or axillary nodes  Abdomen:  normal findings: no organomegaly, soft, non-tender and no hernia  Pelvis:  External genitalia: normal general appearance Urinary system: urethral meatus normal and bladder without fullness, nontender Vaginal: normal without tenderness, induration or masses Cervix: normal appearance Adnexa: normal bimanual exam Uterus: anteverted and non-tender, normal size   Lab Review Urine pregnancy test Labs reviewed yes Radiologic studies reviewed no  I have  spent a total of 20 minutes of face-to-face time, excluding clinical staff time, reviewing notes and preparing to see patient, ordering tests and/or medications, and counseling the patient.   Assessment:    1. Encounter for gynecological examination with Papanicolaou smear of cervix Rx: - Cytology - PAP( [place])  2. Dysmenorrhea Rx: - ibuprofen (ADVIL) 800 MG tablet; Take 1 tablet (800 mg total) by mouth every 8 (eight) hours as needed. Start taking monthly from the 1st day of period until the end of period.  Dispense: 30 tablet; Refill: 5  3. Vaginal discharge Rx: - Cervicovaginal ancillary only( [place])  4. Screen for STD (sexually transmitted disease) Rx: - HIV antibody (with reflex) - Hepatitis C Antibody - RPR - Hepatitis B Surface AntiGEN  5. General counseling and advice on female contraception - discussed options.  Wants the [employer].  6. Encounter for initial prescription of transdermal patch hormonal contraceptive device Rx: - POCT urine pregnancy:  Negative - XULANE 150-35 MCG/24HR transdermal patch; Place 1 patch onto the skin once a week.  Dispense: 3 patch; Refill: 12  7. Iron deficiency anemia secondary to inadequate dietary iron intake Rx: - CBC - Ferritin    Plan:    Education reviewed: calcium supplements, depression evaluation, low fat, low cholesterol diet, safe sex/STD prevention, self breast exams, and weight bearing exercise. Contraception: [employer] weekly. Follow up in: 1 year.   Meds ordered this encounter  Medications   ibuprofen (ADVIL) 800 MG tablet    Sig: Take 1 tablet (800 mg total) by mouth every 8 (eight) hours as needed. Start taking monthly from the 1st day of period until the end of period.    Dispense:  30 tablet    Refill:  5   XULANE 150-35 MCG/24HR transdermal patch    Sig: Place 1 patch onto the skin once a week.    Dispense:  3 patch    Refill:  12   Orders Placed This Encounter  Procedures   HIV  antibody (with reflex)   Hepatitis C Antibody   RPR   Hepatitis B Surface AntiGEN   CBC   Ferritin   POCT urine pregnancy     Shelly Bombard, MD 09/18/2022 10:57 PM

## 2022-09-19 LAB — CBC
Hematocrit: 39.4 % (ref 34.0–46.6)
Hemoglobin: 12.9 g/dL (ref 11.1–15.9)
MCH: 29 pg (ref 26.6–33.0)
MCHC: 32.7 g/dL (ref 31.5–35.7)
MCV: 89 fL (ref 79–97)
Platelets: 236 10*3/uL (ref 150–450)
RBC: 4.45 x10E6/uL (ref 3.77–5.28)
RDW: 13.7 % (ref 11.7–15.4)
WBC: 4.2 10*3/uL (ref 3.4–10.8)

## 2022-09-19 LAB — FERRITIN: Ferritin: 13 ng/mL — ABNORMAL LOW (ref 15–150)

## 2022-09-19 LAB — HEPATITIS B SURFACE ANTIGEN: Hepatitis B Surface Ag: NEGATIVE

## 2022-09-19 LAB — CERVICOVAGINAL ANCILLARY ONLY
Chlamydia: NEGATIVE
Comment: NEGATIVE
Comment: NEGATIVE
Comment: NORMAL
Neisseria Gonorrhea: NEGATIVE
Trichomonas: NEGATIVE

## 2022-09-19 LAB — HIV ANTIBODY (ROUTINE TESTING W REFLEX): HIV Screen 4th Generation wRfx: NONREACTIVE

## 2022-09-19 LAB — POCT URINE PREGNANCY: Preg Test, Ur: NEGATIVE

## 2022-09-19 LAB — HEPATITIS C ANTIBODY: Hep C Virus Ab: NONREACTIVE

## 2022-09-19 LAB — RPR: RPR Ser Ql: NONREACTIVE

## 2022-09-19 LAB — CYTOLOGY - PAP: Diagnosis: NEGATIVE

## 2022-11-07 ENCOUNTER — Ambulatory Visit (INDEPENDENT_AMBULATORY_CARE_PROVIDER_SITE_OTHER): Payer: Medicaid Other | Admitting: Obstetrics

## 2022-11-07 ENCOUNTER — Encounter: Payer: Self-pay | Admitting: Obstetrics

## 2022-11-07 VITALS — BP 107/68 | HR 90 | Ht 61.0 in | Wt 90.4 lb

## 2022-11-07 DIAGNOSIS — Z3009 Encounter for other general counseling and advice on contraception: Secondary | ICD-10-CM | POA: Diagnosis not present

## 2022-11-07 DIAGNOSIS — Z8669 Personal history of other diseases of the nervous system and sense organs: Secondary | ICD-10-CM | POA: Diagnosis not present

## 2022-11-07 DIAGNOSIS — Z3041 Encounter for surveillance of contraceptive pills: Secondary | ICD-10-CM | POA: Diagnosis not present

## 2022-11-07 DIAGNOSIS — N946 Dysmenorrhea, unspecified: Secondary | ICD-10-CM | POA: Diagnosis not present

## 2022-11-07 MED ORDER — SLYND 4 MG PO TABS: 1.0000 | ORAL_TABLET | Freq: Every day | ORAL | 11 refills | Status: AC

## 2022-11-07 NOTE — Progress Notes (Signed)
GYN patient is in the office reporting painful menstrual cycles, started back in 2021.Changes pads 5-6 times per day, lasting anywhere from 5-7 days. Reports cramps 10/10. LMP 10/13/22 Last pap 09/18/2022 Currently on Xulane patch

## 2022-11-07 NOTE — Progress Notes (Signed)
Patient ID: Elizabeth Stanley, female   DOB: 1996/07/14, 27 y.o.   MRN: [id number]  Chief Complaint  Patient presents with   GYN    HPI Elizabeth Stanley is a 27 y.o. female.  Complains of severe cramps with periods.  Also has history of migraines.  Contraception:  Xulane Transdermal Patch HPI  Past Medical History:  Diagnosis Date   Wolf-Parkinson-White syndrome     Past Surgical History:  Procedure Laterality Date   ABLATION  2014    History reviewed. No pertinent family history.  Social History Social History   Tobacco Use   Smoking status: Never   Smokeless tobacco: Never  Vaping Use   Vaping Use: Never used  Substance Use Topics   Alcohol use: Yes   Drug use: No    Allergies  Allergen Reactions   Penicillins Palpitations    Current Outpatient Medications  Medication Sig Dispense Refill   XULANE 150-35 MCG/24HR transdermal patch Place 1 patch onto the skin once a week. 3 patch 12   Ergocalciferol (VITAMIN D2) 50 MCG (2000 UT) TABS Take by mouth. (Patient not taking: Reported on 11/07/2022)     ibuprofen (ADVIL) 800 MG tablet Take 1 tablet (800 mg total) by mouth every 8 (eight) hours as needed. Start taking monthly from the 1st day of period until the end of period. (Patient not taking: Reported on 11/07/2022) 30 tablet 5   rizatriptan (MAXALT) 5 MG tablet Take 1 tablet (5 mg total) by mouth as needed for migraine. May repeat in 2 hours if needed (Patient not taking: Reported on 11/07/2022) 15 tablet 1   No current facility-administered medications for this visit.    Review of Systems Review of Systems Constitutional: negative for fatigue and weight loss Respiratory: negative for cough and wheezing Cardiovascular: negative for chest pain, fatigue and palpitations Gastrointestinal: negative for abdominal pain and change in bowel habits Genitourinary: positive for severe dysmenorrhea Integument/breast: negative for nipple discharge Musculoskeletal:negative  for myalgias Neurological: negative for gait problems and tremors Behavioral/Psych: negative for abusive relationship, depression Endocrine: negative for temperature intolerance      Blood pressure 107/68, pulse 90, height  (1.549 m), weight 90 lb 6.4 oz (41 kg), last menstrual period 10/13/2022.  Physical Exam Physical Exam General:   Alert and no distress  Skin:   no rash or abnormalities  Lungs:   clear to auscultation bilaterally  Heart:   regular rate and rhythm, S1, S2 normal, no murmur, click, rub or gallop  Breasts:   Not examined  Abdomen:  normal findings: no organomegaly, soft, non-tender and no hernia  Pelvis:  External genitalia: normal general appearance Urinary system: urethral meatus normal and bladder without fullness, nontender Vaginal: normal without tenderness, induration or masses Cervix: normal appearance Adnexa: normal bimanual exam Uterus: anteverted and non-tender, normal size    I have spent a total of 20 minutes of face-to-face time, excluding clinical staff time, reviewing notes and preparing to see patient, ordering tests and/or medications, and counseling the patient.   Data Reviewed Meds Labs  Assessment     1. Dysmenorrhea - progestin-only contraception recommended  2. Hx of migraines - continue Maxalt prn - d/c estrogen contraceptives  3. General counseling and advice on female contraception.   - no estrogen - Slynd recommended  4. Encounter for surveillance of contraceptive pills Rx: - Drospirenone (SLYND) 4 MG TABS; Take 1 tablet (4 mg total) by mouth daily before breakfast.  Dispense: 28 tablet; Refill: 11  Plan    Follow up in 3 months virtually  Brock Bad, MD 11/07/2022 3:04 PM

## 2022-11-08 ENCOUNTER — Encounter (HOSPITAL_COMMUNITY): Payer: Self-pay

## 2022-11-08 ENCOUNTER — Emergency Department (HOSPITAL_COMMUNITY)
Admission: EM | Admit: 2022-11-08 | Discharge: 2022-11-09 | Discharge status: Against Medical Advice | Payer: Medicaid Other | Attending: Emergency Medicine | Admitting: Emergency Medicine

## 2022-11-08 DIAGNOSIS — R519 Headache, unspecified: Secondary | ICD-10-CM | POA: Diagnosis present

## 2022-11-08 DIAGNOSIS — Z5321 Procedure and treatment not carried out due to patient leaving prior to being seen by health care provider: Secondary | ICD-10-CM | POA: Diagnosis not present

## 2022-11-08 NOTE — ED Triage Notes (Signed)
Pt arrived POV for "head pain" reports "feels like someone is stabbing me in the back of head" that is intermittent, pain also radiates around right eye like pressure. Started last night, but has also occurred a few weeks ago. No other s/s, NAD noted, A&O x4.

## 2022-11-08 NOTE — ED Notes (Signed)
Pt called to take to treatment RM 14, no answer from pt, pt not seen in [facility] lobby, will try again momentary.

## 2022-11-08 NOTE — ED Notes (Signed)
Pt called twice for a treatment room, pt did not answer, pt not seen in [facility] lobby

## 2022-11-09 NOTE — ED Notes (Signed)
3rd call for pt to treatment area, no answer from pt. Pt to be removed from ED board.
